# Patient Record
Sex: Male | Born: 1935 | Race: White | Hispanic: No | Marital: Married | Smoking: Former smoker
Health system: Southern US, Community
[De-identification: ages and names within clinical notes are randomized; demographics above are authoritative.]

## PROBLEM LIST (undated history)

## (undated) DIAGNOSIS — J189 Pneumonia, unspecified organism: Secondary | ICD-10-CM

## (undated) DIAGNOSIS — C801 Malignant (primary) neoplasm, unspecified: Secondary | ICD-10-CM

## (undated) DIAGNOSIS — I10 Essential (primary) hypertension: Secondary | ICD-10-CM

## (undated) DIAGNOSIS — E7800 Pure hypercholesterolemia, unspecified: Secondary | ICD-10-CM

## (undated) DIAGNOSIS — H3321 Serous retinal detachment, right eye: Secondary | ICD-10-CM

## (undated) DIAGNOSIS — I639 Cerebral infarction, unspecified: Secondary | ICD-10-CM

## (undated) HISTORY — DX: Essential (primary) hypertension: I10

## (undated) HISTORY — DX: Cerebral infarction, unspecified: I63.9

## (undated) HISTORY — DX: Pure hypercholesterolemia, unspecified: E78.00

## (undated) HISTORY — DX: Pneumonia, unspecified organism: J18.9

## (undated) HISTORY — PX: APPENDECTOMY: SHX54

## (undated) HISTORY — PX: CATARACT EXTRACTION: SUR2

## (undated) HISTORY — DX: Serous retinal detachment, right eye: H33.21

---

## 1998-09-03 ENCOUNTER — Ambulatory Visit: Admission: RE | Admit: 1998-09-03 | Discharge: 1998-09-03 | Payer: Self-pay | Admitting: Family Medicine

## 1998-10-11 ENCOUNTER — Ambulatory Visit (HOSPITAL_BASED_OUTPATIENT_CLINIC_OR_DEPARTMENT_OTHER): Admission: RE | Admit: 1998-10-11 | Discharge: 1998-10-11 | Payer: Self-pay | Admitting: Orthopedic Surgery

## 1999-02-19 ENCOUNTER — Ambulatory Visit (HOSPITAL_COMMUNITY): Admission: RE | Admit: 1999-02-19 | Discharge: 1999-02-20 | Payer: Self-pay | Admitting: Ophthalmology

## 1999-09-17 ENCOUNTER — Encounter: Admission: RE | Admit: 1999-09-17 | Discharge: 1999-09-17 | Payer: Self-pay | Admitting: *Deleted

## 2001-05-29 ENCOUNTER — Encounter: Payer: Self-pay | Admitting: Internal Medicine

## 2001-05-29 ENCOUNTER — Inpatient Hospital Stay (HOSPITAL_COMMUNITY): Admission: EM | Admit: 2001-05-29 | Discharge: 2001-05-31 | Payer: Self-pay

## 2001-05-30 ENCOUNTER — Encounter: Payer: Self-pay | Admitting: Internal Medicine

## 2001-08-09 ENCOUNTER — Encounter: Payer: Self-pay | Admitting: Ophthalmology

## 2001-08-10 ENCOUNTER — Ambulatory Visit (HOSPITAL_COMMUNITY): Admission: RE | Admit: 2001-08-10 | Discharge: 2001-08-10 | Payer: Self-pay | Admitting: Ophthalmology

## 2001-11-02 ENCOUNTER — Ambulatory Visit (HOSPITAL_COMMUNITY): Admission: RE | Admit: 2001-11-02 | Discharge: 2001-11-02 | Payer: Self-pay | Admitting: Ophthalmology

## 2005-04-27 ENCOUNTER — Inpatient Hospital Stay (HOSPITAL_COMMUNITY): Admission: EM | Admit: 2005-04-27 | Discharge: 2005-04-29 | Payer: Self-pay | Admitting: Emergency Medicine

## 2005-05-14 ENCOUNTER — Encounter: Admission: RE | Admit: 2005-05-14 | Discharge: 2005-05-14 | Payer: Self-pay | Admitting: Family Medicine

## 2005-10-31 ENCOUNTER — Encounter: Admission: RE | Admit: 2005-10-31 | Discharge: 2005-10-31 | Payer: Self-pay | Admitting: Family Medicine

## 2006-11-02 ENCOUNTER — Encounter: Admission: RE | Admit: 2006-11-02 | Discharge: 2006-11-02 | Payer: Self-pay | Admitting: Family Medicine

## 2006-11-19 ENCOUNTER — Encounter: Admission: RE | Admit: 2006-11-19 | Discharge: 2006-11-19 | Payer: Self-pay | Admitting: General Surgery

## 2007-03-11 ENCOUNTER — Encounter: Admission: RE | Admit: 2007-03-11 | Discharge: 2007-03-11 | Payer: Self-pay | Admitting: Family Medicine

## 2007-03-22 ENCOUNTER — Ambulatory Visit (HOSPITAL_COMMUNITY): Admission: RE | Admit: 2007-03-22 | Discharge: 2007-03-22 | Payer: Self-pay | Admitting: Ophthalmology

## 2007-04-19 ENCOUNTER — Ambulatory Visit: Payer: Self-pay | Admitting: Internal Medicine

## 2007-04-28 ENCOUNTER — Encounter (INDEPENDENT_AMBULATORY_CARE_PROVIDER_SITE_OTHER): Payer: Self-pay | Admitting: Diagnostic Radiology

## 2007-04-28 ENCOUNTER — Ambulatory Visit (HOSPITAL_COMMUNITY): Admission: RE | Admit: 2007-04-28 | Discharge: 2007-04-28 | Payer: Self-pay | Admitting: Internal Medicine

## 2007-05-05 ENCOUNTER — Ambulatory Visit: Payer: Self-pay | Admitting: Internal Medicine

## 2007-05-06 ENCOUNTER — Ambulatory Visit: Payer: Self-pay | Admitting: Internal Medicine

## 2007-06-02 ENCOUNTER — Ambulatory Visit: Payer: Self-pay | Admitting: Thoracic Surgery

## 2007-06-08 ENCOUNTER — Ambulatory Visit (HOSPITAL_COMMUNITY): Admission: RE | Admit: 2007-06-08 | Discharge: 2007-06-08 | Payer: Self-pay | Admitting: Thoracic Surgery

## 2007-06-09 ENCOUNTER — Ambulatory Visit: Admission: RE | Admit: 2007-06-09 | Discharge: 2007-07-14 | Payer: Self-pay | Admitting: Radiation Oncology

## 2007-06-22 ENCOUNTER — Encounter: Payer: Self-pay | Admitting: Thoracic Surgery

## 2007-06-22 ENCOUNTER — Ambulatory Visit: Payer: Self-pay | Admitting: Thoracic Surgery

## 2007-06-22 ENCOUNTER — Inpatient Hospital Stay (HOSPITAL_COMMUNITY): Admission: RE | Admit: 2007-06-22 | Discharge: 2007-06-26 | Payer: Self-pay | Admitting: Thoracic Surgery

## 2007-06-30 ENCOUNTER — Ambulatory Visit: Payer: Self-pay | Admitting: Thoracic Surgery

## 2007-06-30 ENCOUNTER — Encounter: Admission: RE | Admit: 2007-06-30 | Discharge: 2007-06-30 | Payer: Self-pay | Admitting: Thoracic Surgery

## 2007-07-13 ENCOUNTER — Encounter: Admission: RE | Admit: 2007-07-13 | Discharge: 2007-07-13 | Payer: Self-pay | Admitting: Thoracic Surgery

## 2007-07-13 ENCOUNTER — Ambulatory Visit: Payer: Self-pay | Admitting: Thoracic Surgery

## 2007-07-13 ENCOUNTER — Ambulatory Visit: Payer: Self-pay | Admitting: Internal Medicine

## 2007-07-15 ENCOUNTER — Ambulatory Visit: Admission: RE | Admit: 2007-07-15 | Discharge: 2007-10-04 | Payer: Self-pay | Admitting: Radiation Oncology

## 2007-07-28 ENCOUNTER — Encounter: Payer: Self-pay | Admitting: Internal Medicine

## 2007-07-28 LAB — CBC WITH DIFFERENTIAL/PLATELET
BASO%: 0.2 % (ref 0.0–2.0)
EOS%: 2.1 % (ref 0.0–7.0)
HCT: 34.1 % — ABNORMAL LOW (ref 38.7–49.9)
LYMPH%: 15 % (ref 14.0–48.0)
MCH: 31.9 pg (ref 28.0–33.4)
MCHC: 34.1 g/dL (ref 32.0–35.9)
MCV: 93.5 fL (ref 81.6–98.0)
MONO%: 6.8 % (ref 0.0–13.0)
NEUT%: 75.9 % — ABNORMAL HIGH (ref 40.0–75.0)
Platelets: 254 10*3/uL (ref 145–400)
RBC: 3.65 10*6/uL — ABNORMAL LOW (ref 4.20–5.71)

## 2007-07-28 LAB — COMPREHENSIVE METABOLIC PANEL
ALT: 13 U/L (ref 0–53)
AST: 21 U/L (ref 0–37)
Alkaline Phosphatase: 71 U/L (ref 39–117)
CO2: 25 mEq/L (ref 19–32)
Creatinine, Ser: 1.36 mg/dL (ref 0.40–1.50)
Total Bilirubin: 0.4 mg/dL (ref 0.3–1.2)

## 2007-08-12 ENCOUNTER — Encounter: Admission: RE | Admit: 2007-08-12 | Discharge: 2007-08-12 | Payer: Self-pay | Admitting: Thoracic Surgery

## 2007-08-12 ENCOUNTER — Ambulatory Visit: Payer: Self-pay | Admitting: Thoracic Surgery

## 2007-08-23 ENCOUNTER — Ambulatory Visit (HOSPITAL_COMMUNITY): Admission: RE | Admit: 2007-08-23 | Discharge: 2007-08-23 | Payer: Self-pay | Admitting: Ophthalmology

## 2007-10-07 ENCOUNTER — Ambulatory Visit: Payer: Self-pay | Admitting: Thoracic Surgery

## 2007-10-07 ENCOUNTER — Encounter: Admission: RE | Admit: 2007-10-07 | Discharge: 2007-10-07 | Payer: Self-pay | Admitting: Thoracic Surgery

## 2007-12-31 ENCOUNTER — Ambulatory Visit: Payer: Self-pay | Admitting: Internal Medicine

## 2008-01-04 ENCOUNTER — Ambulatory Visit (HOSPITAL_COMMUNITY): Admission: RE | Admit: 2008-01-04 | Discharge: 2008-01-04 | Payer: Self-pay | Admitting: Internal Medicine

## 2008-01-04 LAB — CBC WITH DIFFERENTIAL/PLATELET
BASO%: 0.3 % (ref 0.0–2.0)
EOS%: 2.7 % (ref 0.0–7.0)
HCT: 37.2 % — ABNORMAL LOW (ref 38.7–49.9)
LYMPH%: 17.2 % (ref 14.0–48.0)
MCH: 32.2 pg (ref 28.0–33.4)
MCHC: 34.2 g/dL (ref 32.0–35.9)
MONO#: 0.6 10*3/uL (ref 0.1–0.9)
NEUT%: 70.6 % (ref 40.0–75.0)
Platelets: 195 10*3/uL (ref 145–400)
RBC: 3.95 10*6/uL — ABNORMAL LOW (ref 4.20–5.71)
WBC: 6.7 10*3/uL (ref 4.0–10.0)
lymph#: 1.1 10*3/uL (ref 0.9–3.3)

## 2008-01-04 LAB — COMPREHENSIVE METABOLIC PANEL
ALT: 27 U/L (ref 0–53)
AST: 34 U/L (ref 0–37)
Creatinine, Ser: 1.41 mg/dL (ref 0.40–1.50)
Sodium: 140 mEq/L (ref 135–145)
Total Bilirubin: 0.7 mg/dL (ref 0.3–1.2)
Total Protein: 6.7 g/dL (ref 6.0–8.3)

## 2008-01-11 ENCOUNTER — Ambulatory Visit: Payer: Self-pay | Admitting: Thoracic Surgery

## 2008-01-11 ENCOUNTER — Encounter: Payer: Self-pay | Admitting: Internal Medicine

## 2008-06-30 ENCOUNTER — Ambulatory Visit: Payer: Self-pay | Admitting: Internal Medicine

## 2008-07-04 ENCOUNTER — Ambulatory Visit (HOSPITAL_COMMUNITY): Admission: RE | Admit: 2008-07-04 | Discharge: 2008-07-04 | Payer: Self-pay | Admitting: Internal Medicine

## 2008-07-12 LAB — CBC WITH DIFFERENTIAL/PLATELET
BASO%: 0.2 % (ref 0.0–2.0)
EOS%: 5.8 % (ref 0.0–7.0)
HCT: 36.2 % — ABNORMAL LOW (ref 38.7–49.9)
MCH: 33 pg (ref 28.0–33.4)
MCHC: 34.1 g/dL (ref 32.0–35.9)
MONO#: 0.5 10*3/uL (ref 0.1–0.9)
NEUT%: 70 % (ref 40.0–75.0)
RBC: 3.75 10*6/uL — ABNORMAL LOW (ref 4.20–5.71)
RDW: 13.2 % (ref 11.2–14.6)
WBC: 6.6 10*3/uL (ref 4.0–10.0)
lymph#: 1.1 10*3/uL (ref 0.9–3.3)

## 2008-07-12 LAB — COMPREHENSIVE METABOLIC PANEL
ALT: 31 U/L (ref 0–53)
AST: 30 U/L (ref 0–37)
CO2: 27 mEq/L (ref 19–32)
Calcium: 8.9 mg/dL (ref 8.4–10.5)
Chloride: 104 mEq/L (ref 96–112)
Creatinine, Ser: 1.48 mg/dL (ref 0.40–1.50)
Sodium: 135 mEq/L (ref 135–145)
Total Protein: 7 g/dL (ref 6.0–8.3)

## 2008-07-18 ENCOUNTER — Ambulatory Visit: Payer: Self-pay | Admitting: Thoracic Surgery

## 2008-07-28 ENCOUNTER — Encounter: Admission: RE | Admit: 2008-07-28 | Discharge: 2008-07-28 | Payer: Self-pay | Admitting: Family Medicine

## 2008-12-25 ENCOUNTER — Ambulatory Visit: Payer: Self-pay | Admitting: Internal Medicine

## 2008-12-27 ENCOUNTER — Ambulatory Visit (HOSPITAL_COMMUNITY): Admission: RE | Admit: 2008-12-27 | Discharge: 2008-12-27 | Payer: Self-pay | Admitting: Internal Medicine

## 2008-12-27 LAB — CBC WITH DIFFERENTIAL/PLATELET
BASO%: 0.2 % (ref 0.0–2.0)
EOS%: 5.2 % (ref 0.0–7.0)
HCT: 37.1 % — ABNORMAL LOW (ref 38.4–49.9)
HGB: 12.9 g/dL — ABNORMAL LOW (ref 13.0–17.1)
MCHC: 34.8 g/dL (ref 32.0–36.0)
MONO#: 0.5 10*3/uL (ref 0.1–0.9)
NEUT%: 63.5 % (ref 39.0–75.0)
RDW: 13.1 % (ref 11.0–14.6)
WBC: 5.3 10*3/uL (ref 4.0–10.3)
lymph#: 1.2 10*3/uL (ref 0.9–3.3)

## 2008-12-27 LAB — COMPREHENSIVE METABOLIC PANEL
ALT: 26 U/L (ref 0–53)
AST: 30 U/L (ref 0–37)
Albumin: 3.5 g/dL (ref 3.5–5.2)
CO2: 28 mEq/L (ref 19–32)
Calcium: 9 mg/dL (ref 8.4–10.5)
Chloride: 105 mEq/L (ref 96–112)
Creatinine, Ser: 1.78 mg/dL — ABNORMAL HIGH (ref 0.40–1.50)
Potassium: 4.5 mEq/L (ref 3.5–5.3)
Total Protein: 6.9 g/dL (ref 6.0–8.3)

## 2009-01-03 ENCOUNTER — Encounter: Payer: Self-pay | Admitting: Internal Medicine

## 2009-06-28 ENCOUNTER — Ambulatory Visit: Payer: Self-pay | Admitting: Internal Medicine

## 2009-07-02 ENCOUNTER — Ambulatory Visit (HOSPITAL_COMMUNITY): Admission: RE | Admit: 2009-07-02 | Discharge: 2009-07-02 | Payer: Self-pay | Admitting: Internal Medicine

## 2009-07-02 LAB — CBC WITH DIFFERENTIAL/PLATELET
Basophils Absolute: 0 10*3/uL (ref 0.0–0.1)
HCT: 37.6 % — ABNORMAL LOW (ref 38.4–49.9)
HGB: 12.6 g/dL — ABNORMAL LOW (ref 13.0–17.1)
MONO#: 0.6 10*3/uL (ref 0.1–0.9)
NEUT#: 5 10*3/uL (ref 1.5–6.5)
NEUT%: 70.1 % (ref 39.0–75.0)
WBC: 7.1 10*3/uL (ref 4.0–10.3)
lymph#: 1.2 10*3/uL (ref 0.9–3.3)

## 2009-07-02 LAB — COMPREHENSIVE METABOLIC PANEL
ALT: 27 U/L (ref 0–53)
Albumin: 3.8 g/dL (ref 3.5–5.2)
BUN: 24 mg/dL — ABNORMAL HIGH (ref 6–23)
CO2: 27 mEq/L (ref 19–32)
Calcium: 9.1 mg/dL (ref 8.4–10.5)
Chloride: 105 mEq/L (ref 96–112)
Creatinine, Ser: 1.81 mg/dL — ABNORMAL HIGH (ref 0.40–1.50)
Potassium: 4.5 mEq/L (ref 3.5–5.3)

## 2009-07-04 ENCOUNTER — Encounter: Payer: Self-pay | Admitting: Internal Medicine

## 2009-12-27 ENCOUNTER — Ambulatory Visit: Payer: Self-pay | Admitting: Internal Medicine

## 2009-12-31 ENCOUNTER — Ambulatory Visit (HOSPITAL_COMMUNITY): Admission: RE | Admit: 2009-12-31 | Discharge: 2009-12-31 | Payer: Self-pay | Admitting: Internal Medicine

## 2009-12-31 LAB — CBC WITH DIFFERENTIAL/PLATELET
Basophils Absolute: 0 10*3/uL (ref 0.0–0.1)
EOS%: 5.2 % (ref 0.0–7.0)
HCT: 38.1 % — ABNORMAL LOW (ref 38.4–49.9)
HGB: 12.6 g/dL — ABNORMAL LOW (ref 13.0–17.1)
LYMPH%: 16.6 % (ref 14.0–49.0)
MCH: 32.4 pg (ref 27.2–33.4)
MCV: 97.8 fL (ref 79.3–98.0)
MONO%: 6.4 % (ref 0.0–14.0)
NEUT%: 71.6 % (ref 39.0–75.0)
Platelets: 196 10*3/uL (ref 140–400)

## 2009-12-31 LAB — COMPREHENSIVE METABOLIC PANEL
AST: 33 U/L (ref 0–37)
Alkaline Phosphatase: 41 U/L (ref 39–117)
BUN: 25 mg/dL — ABNORMAL HIGH (ref 6–23)
Calcium: 9.1 mg/dL (ref 8.4–10.5)
Chloride: 105 mEq/L (ref 96–112)
Creatinine, Ser: 2.06 mg/dL — ABNORMAL HIGH (ref 0.40–1.50)

## 2010-01-02 ENCOUNTER — Encounter: Payer: Self-pay | Admitting: Internal Medicine

## 2010-06-28 ENCOUNTER — Ambulatory Visit: Payer: Self-pay | Admitting: Internal Medicine

## 2010-07-01 ENCOUNTER — Ambulatory Visit (HOSPITAL_COMMUNITY)
Admission: RE | Admit: 2010-07-01 | Discharge: 2010-07-01 | Payer: Self-pay | Source: Home / Self Care | Attending: Internal Medicine | Admitting: Internal Medicine

## 2010-07-01 LAB — COMPREHENSIVE METABOLIC PANEL
BUN: 23 mg/dL (ref 6–23)
CO2: 25 mEq/L (ref 19–32)
Calcium: 8.1 mg/dL — ABNORMAL LOW (ref 8.4–10.5)
Chloride: 103 mEq/L (ref 96–112)
Creatinine, Ser: 1.97 mg/dL — ABNORMAL HIGH (ref 0.40–1.50)
Glucose, Bld: 132 mg/dL — ABNORMAL HIGH (ref 70–99)

## 2010-07-01 LAB — CBC WITH DIFFERENTIAL/PLATELET
Basophils Absolute: 0 10*3/uL (ref 0.0–0.1)
EOS%: 4.4 % (ref 0.0–7.0)
HCT: 35.6 % — ABNORMAL LOW (ref 38.4–49.9)
HGB: 12.3 g/dL — ABNORMAL LOW (ref 13.0–17.1)
MCH: 33.3 pg (ref 27.2–33.4)
NEUT%: 70.9 % (ref 39.0–75.0)
lymph#: 1.2 10*3/uL (ref 0.9–3.3)

## 2010-07-03 ENCOUNTER — Encounter: Payer: Self-pay | Admitting: Internal Medicine

## 2010-08-03 ENCOUNTER — Other Ambulatory Visit: Payer: Self-pay | Admitting: Internal Medicine

## 2010-08-03 DIAGNOSIS — C3490 Malignant neoplasm of unspecified part of unspecified bronchus or lung: Secondary | ICD-10-CM

## 2010-08-04 ENCOUNTER — Encounter: Payer: Self-pay | Admitting: Thoracic Surgery

## 2010-08-15 NOTE — Letter (Signed)
[facility]   Imported By: Lester [place] 08/24/2009 09:41:50  _____________________________________________________________________  External Attachment:    Type:   Image     Comment:   External Document

## 2010-08-15 NOTE — Letter (Signed)
[facility]   Imported By: Lester Fannin 01/22/2010 09:04:26  _____________________________________________________________________  External Attachment:    Type:   Image     Comment:   External Document

## 2010-08-15 NOTE — Letter (Signed)
Summary: [facility]  [facility]   Imported By: Lester Long 07/17/2010 09:47:55  _____________________________________________________________________  External Attachment:    Type:   Image     Comment:   External Document

## 2010-11-26 NOTE — Op Note (Signed)
[name], VULTAGGIO               ACCOUNT NO.:  [id number]   MEDICAL RECORD NO.:  [id number]          PATIENT TYPE:  INP   LOCATION:  2550                         FACILITY:  [facility]   PHYSICIAN:  Ines Bloomer, M.D. DATE OF BIRTH:  Mar 16, 1928   DATE OF PROCEDURE:  DATE OF DISCHARGE:                               OPERATIVE REPORT   PREOPERATIVE DIAGNOSES:  Right lower lobe mass.   POSTOPERATIVE DIAGNOSES:  Adenocarcinoma right lower lobe.   OPERATION PERFORMED:  Right VATS, mini thoracotomy, right lower lobe  superior segmentectomy with seed implantation.   SURGEON:  Ines Bloomer, M.D.   FIRST ASSISTANT:  Coral Ceo, P.A.   ANESTHESIA:  General anesthesia.   After percutaneous insertion of all monitored lines, the patient  underwent general anesthesia and was prepped and draped in the usual  sterile manner, turned in the right lateral thoracotomy position. A dual  lumen tube was inserted, the right lung was deflated, two trocar sites  remained in the anterior and mid axillary line at the 7th intercostal  space. Two trocars were inserted and the lung was deflated and then a  small 7-8 cm incision was made over the triangle of auscultation  dividing the latissimus and reflecting the serratus anteriorly and  entering the fifth intercostal space. A smallTuffier was placed in the  space. The dissection was started in the fissure, there was complete  fissure. We had to dissect down until we finally identified the artery  and then dissecting posteriorly dissected up the subcarinal space  removing  7 nodes and then dissecting up along the bronchus intermedius.  We then turned back to the fissure and identified a large superior  segmental artery which was divided  proximally and distally with 0 silk  clips and divided it. This exposed several 10R and 11R nodes which were  dissected free from around the bronchus and the bronchus intermedius and  the superior segmental bronchus.  The superior segmental bronchus ws  dissected out, stapled and divided with the auto suture 30 mm blue  stapler. The inferior pulmonary vein to the superior segment was then  doubly ligated with #0 silk, clipped and divided and the superior  segment was divided first dividing down the major fissure with the  autosuture stapler and 2 applications of the green stapler and then  dividing across the superior segment with the autosuture stapler and the  EZ-45 Ethicon stapler. The superior segment was then placed in an  Endobag and removed. Frozen section revealed adenocarcinoma with  negative margins. The Coseal  was applied to the staple line and then we  placed sutures along the staple line to suture in the radioactive seeds  using a coronary marker at the spot closest to the cancer and sutured  that in place with 4-0 Prolene and then suturing that to the seed mesh.  When the seed had been sutured down and lay just right in the fissure,  two chest tubes were brought in through the trocar sites and tied in  place with #0 silk. A Marcaine block was done in the usual fashion.  A  single On-  Q was inserted in the usual fashion. The chest was closed with three  pericostals drilling to the 7th rib and passed around the 6th rib with  Vicryl in the muscle layer, 2-0 Vicryl in the subcutaneous tissue and  Dermabond for the skin. The patient was returned to the recovery room in  stable condition.      Ines Bloomer, M.D.  Electronically Signed     DPB/MEDQ  D:  06/22/2007  T:  06/22/2007  Job:  [id number]   cc:   Artist Pais Kathrynn Running, M.D.  Kalman Shan, MD

## 2010-11-26 NOTE — Letter (Signed)
May 06, 2007    [name], M.D.  [address] [place], [place] [address]   RE:  Antonio, [name]  MRN:  [id number]  /  DOB:  [birth year]   Dear Dr. Arvilla Market:   PROBLEM LIST:  1. Pulmonary nodule, right lower lobe superior segment, 1.1 cm = >95%      pre-test probability for nsclc  2. CT-guided transthoracic needle aspiration on April 28, 2007,      highly suspicious for non-small cell lung cancer.  3. Normal pulmonary function tests, FEV1 2.3 L, 85% predicted, DLCO      79% predicted.   HISTORY OF PRESENT ILLNESS:  [name] returns for followup  after undergoing CT-guided transthoracic needle aspiration of his right  lower lobe superior segment nodule on April 28, 2007.  The radiologist  felt this was a technically challenging study.  The first attempt was  needle aspiration that showed atypical cells which are highly suspicious  for non-small cell lung carcinoma.  This was followed by a CT-guided  transthoracic core biopsy which showed fibro connective tissue stroma  with scanty epithelial cells.  They did not identify malignancy in this.  In the interim he reports no new symptoms.  He feels fine and is  asymptomatic.   PAST MEDICAL HISTORY:  Reviewed, same as April 19, 2007.   Past surgical history, allergies, medications, social history, family  history, review of systems all unchanged compared to April 19, 2007.   PHYSICAL EXAMINATION:  VITAL SIGNS:  Temperature 97.3, blood pressure  132/62, pulse of 71, saturation 98% on room air.  Exam is unchanged from  April 19, 2007.  GENERAL EXAM:  A pleasant male accompanied by wife.  CENTRAL NERVOUS SYSTEM:  Alert and oriented x3.  Walks with a cane due  to joint disease.  HEENT:  Neck is supple, no neck nodes, no elevated JVP.  Respiratory air entry equal on both sides.  CARDIOVASCULAR:  Normal heart sounds.  ABDOMEN:  Soft, obese.  EXTREMITIES:  No cyanosis, no clubbing.  SKIN:  Shows signs of  aging and easy bruising.   LABORATORY EVALUATION:  1. Pulmonary function tests, May 05, 2007, FEV1 2.3 L, 85%, FVC      3.8 L, 90% predicted.  FEV1 FVC ratio is 62% against a predicted of      67%.  The small airway FEF of 25-75 is reduced at 0.83 L, 35%.      Total lung capacity is 6.7 L, 104%, adjusted DLCO was 17.3 mmHg,      79%.  In summary, spirometry and lung volumes are normal.  Perhaps      there is some small airway disease as shown in reduced FEF 25-75      and DLCO being lower limit of normal.   1. CT-guided transthoracic needle aspiration site shows atypical cells      suggestive of non-small cell lung cancer.  Core biopsy did not      identify malignancy.   ASSESSMENT AND PLAN:  Based on [name]'s age, smoking history,  size of nodule, growth over 2 years, and the CT-guided transthoracic  needle aspiration suggesting malignant cells, the pretest probability of  this, indeed, being non-small cell lung cancer is extremely high.  I ran  a calculation on chestx-ray.com, solitary pulmonary nodule calculator,  and the pretest probability was as high as 95% to 98%.  Even assuming  that nly 30% of our patients comes into clinic with a  nodule have  malignancy, the pretest probability is still higher than 90%.   PLAN:  Given normal lung function, I advised referral to Dr. Karle Plumber, thoracic surgeon, for possible evaluation of wedge biopsy and  subsequent lobectomy.  [name] was extremely concerned about his  quality of life.  He did not want himself to suffer or cause any  suffering for his wife and family.  I explained to him that once PET  scan is done, this would most likely be stage I lung cancer which has  60% to 70% five year survival.  Peri and postoperative pulmonary risk is  less than 5%, cardiac risk is also minimal.  Therefore, he could be  willing to undergo lobectomy and have a healthy quality of life after  surgery and subsequent physical  therapy.  He was still reluctant to be  seen by the surgeon.  I then offered him the possibility of undergoing  curative radiation therapy which has low morbidity risk, but has a lower  5-year survival rate.  He was also reluctant to go through this.   I asked his wife what he would want to do or what she would want to do.  She deferred decision to [name].  He left the clinic saying he  wanted to think about all his different options.  He thanked me for  explaining everything clearly.  He will be in touch with [place].  For now he  has  refused to have his PET scan or be seen by Dr. Edwyna Shell.  I even  encouraged him to just go visit with Dr. Edwyna Shell or the radiation  oncologist just for a chat, but he refused.  He wants to evaluate and  think about all this and then call us back.  We gave him the phone  number for clinic, and he will contact us at his convenience.    Sincerely,      Kalman Shan, MD  Electronically Signed    MR/MedQ  DD: 05/06/2007  DT: 05/07/2007  Job #: [id number]

## 2010-11-26 NOTE — Assessment & Plan Note (Signed)
OFFICE VISIT   [name], [name]  DOB:  June 07, 1928                                        October 07, 2007  CHART #:  [id number]   The patient came for follow-up today.  He is doing well.  It is  approximately three months since we did his surgery.  His blood pressure  is 140/80, pulse 80, respirations 18, and saturations 94%.   Chest x-ray shows normal postoperative changes and seems to be in good  position.  I plan to see him back again in three months after his  scheduled CT scan.   Ines Bloomer, M.D.  Electronically Signed   DPB/MEDQ  D:  10/07/2007  T:  10/08/2007  Job:  [id number]

## 2010-11-26 NOTE — Discharge Summary (Signed)
[name], [name]               ACCOUNT NO.:  [id number]   MEDICAL RECORD NO.:  [id number]          PATIENT TYPE:  REC   LOCATION:  RDNC                         FACILITY:  [facility]   PHYSICIAN:  Ines Bloomer, M.D. DATE OF BIRTH:  [birth year]   DATE OF ADMISSION:  06/09/2007  DATE OF DISCHARGE:                               DISCHARGE SUMMARY   FINAL DIAGNOSIS:  Right upper lobe lesion, positive for adenocarcinoma  stage 1AT1M0MX.   SECONDARY DIAGNOSES:  1. Hyperlipidemia.  2. Hypertension.  3. Diabetes mellitus.  4. History of cerebrovascular accident 2003 with some weakness, left-      sided, walks with a cane.  5. Status post appendectomy.  6. 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 Luciana Axe.      No vision in right eye.   OPERATIONS/PROCEDURES:  Right video-assisted thoracoscopic surgery for a  mini-thoracotomy, right lower lobe superior segmentectomy with  radioactive seed implantation and lymph node dissection.   HISTORY/PHYSICAL/HOSPITAL COURSE:  The patient is a 77 year old  Caucasian male who was found to have right lower lobe lesion with  atypical cells on needle biopsy.  He has a long history of granulomas  disease and chronic bronchitis.  Pulmonary function tests showed an FVC  3.79 with an FEV1 of 2.48, infusion capacity of 79%.  The lesion appears  to have a ground-glass appearance and it was also felt this could be  consistent with bronchioalveolar cancer.  PET scan was done showing a  low-level amount of metabolic activity being cancer in the lesion and no  evidence of metastatic disease.  The patient was recommended for a wedge  resection.  The patient was seen and evaluated by Dr. Edwyna Shell.  Dr.  Edwyna Shell discussed with the patient undergoing right VATS with wedge  resection as well as radioactive seed implantation.  He discussed risks  and benefits with the patient.  The patient acknowledged understanding  and agreed to proceed.  Surgery was  scheduled for June 22, 2007.  For  details of the patient's past medical history and physical exam, please  see dictated H&P.   The patient was taken to the operating room where he underwent right  video-assisted thoracoscopic surgery with a right mini-thoracotomy,  right lower lobe superior segmentectomy with radioactive seed  implantation and lymph node dissection.  The patient tolerated the  procedure well and was transferred to the intensive care unit in stable  condition.  The patient's pathology report came back positive showing  adenocarcinoma stage 1AT1M0MX.  The patient's postoperative course was  pretty much unremarkable.  Daily chest x-rays obtained.  Chest x-rays  remained stable and no air leak noted from  chest tube.  Chest tube was  able to be discontinued postop day 2.  Follow up chest x-ray was stable  with no pneumothorax.  The patient was encouraged to use his incentive  spirometer and he eventually was able to be weaned off oxygen sating  greater than 90% on room air.  Postoperatively, the patient's vital  signs were monitored closely.  He remained afebrile.  All vital  signs  remained stable.  The patient's heart rate and blood pressure were  stable and he was not restarted on his lisinopril/HCTZ.  Will follow up  with this as an outpatient.  Postoperatively, the patient's blood sugars  were also followed closely.  The patient does have a history diabetes  mellitus.  He was restarted on his NovoLog as well as Lantus insulin.  The patient had several episodes of hypoglycemia.  NovoLog doses were  decreased and he was continued on Lantus insulin.  Blood sugars were  stable prior to discharge home.  The patient's pulmonary status remained  stable.  He remained in normal sinus rhythm postoperatively.  All  incisions were clean, dry, intact and healing well.  The patient was out  of bed ambulating well without difficulty.  He was tolerating diet well.  No nausea or  vomiting noted.  Noted the patient did have elevated  creatinine level, increased to 1.67.  This was another reason why the  patient's lisinopril was ordered and diuretic was not started.  By  postop day 3, creatinine stabilized to 1.38.  Foley was discontinued and  the patient was started on Flomax.  The patient voiding without  difficulty.   LABORATORY DATA:  Labs postop day 2 showed a white count 12.1,  hemoglobin 11.5, hematocrit 33.8, platelet count 184.  Postop day 3, he  had sodium of 133, potassium 3.8, chloride of 100, bicarbonate 28, BUN  21, creatinine 1.3, glucose of 101.   DISPOSITION:  Patient is tentatively ready for discharge home over the  next 48 hours pending he remains stable.   FOLLOW UP:  A follow up appointment has been arranged with Dr. Edwyna Shell  for June 30, 2007 at 3:15 p.m.  The patient will need to obtain [facility]  chest x-ray 30 minutes prior to this appointment.   ACTIVITY:  Patient instructed no driving till released to do so, no  lifting over 10 pounds.  He is told to ambulate 3-4 times per day,  progress as tolerated and to continue his breathing exercises.   INCISIONAL CARE:  The patient is told to shower washing his incisions  using soap and water.  He is to contact the office if he develops any  drainage or opening from any of his incision sites.   DIET:  The patient is educated on diet to be low-fat, low-salt.   DISCHARGE MEDICATIONS:  1. Lovastatin 20 mg at night.  2. Tricor 145 mg at night.  3. NovoLog 5 units with breakfast; 10 units with lunch and 11 units at      dinner.  4. Fish oil daily.  5. Stool softener as needed.  6. Flomax 0.4 mg daily.  7. Percocet 5/325 1-2 tablets q.4-6 h. p.r.n. pain.  8. The patient is currently on Lantus insulin.  Plan to restart the      patient on Levemir prior to discharge home.  Will follow blood      sugars and prescribe dosage.      Theda Belfast, [place]      Ines Bloomer, M.D.   Electronically Signed    KMD/MEDQ  D:  06/25/2007  T:  06/26/2007  Job:  [id number]   cc:   Edwyna Shell, MD

## 2010-11-26 NOTE — Letter (Signed)
July 18, 2008   Lajuana Matte, MD  [phone]  [place], [place] [address]   Re:  [name], [name]               DOB:  August 16, 1927   Dear Arbutus Ped:   I saw the patient back in today and reviewed his CT scan.  It shows no  evidence of recurrence now 1 year since he had his right lower lobe  superior segmentectomy for a stage IA non-small cell lung cancer.  He  will get another CT scan in 6 months and I will be referring him to you  for long term followup.  If he has any evidence of recurrence, I will be  happy to see him again.  I appreciate the opportunity of seeing the  patient.   Ines Bloomer, M.D.  Electronically Signed   DPB/MEDQ  D:  07/18/2008  T:  07/18/2008  Job:  [id number]

## 2010-11-26 NOTE — Assessment & Plan Note (Signed)
OFFICE VISIT   [name], VIERNES  DOB:  1927/09/17                                        January 11, 2008  CHART #:  [id number]   The patient came today and had a CT scan which showed no evidence of  recurrence of his cancer.  He is doing well overall.  We will see him  back again in December with another CT scan.  Incision is well healed.  His blood pressure was 122/65, pulse 85, respirations 20, and  saturations were 90%.   Ines Bloomer, M.D.  Electronically Signed   DPB/MEDQ  D:  01/11/2008  T:  01/12/2008  Job:  [id number]

## 2010-11-26 NOTE — Letter (Signed)
April 19, 2007    Donia Guiles, M.D.  [address] [place], [place] [address]   RE:  [name], [name]  MRN:  [id number]  /  DOB:  Nov 14, 1927   Dear Dr. Arvilla Market:   CHIEF COMPLAINT:  Pulmonary nodule, right lower lobe superior segment.   Thank you for referring [name].  As you know, he is a  pleasant gentleman, a 77 year old gentleman with right lower lobe lung  nodule.  History from him is that he suffered a pneumonia in October  2006.  He was admitted at [facility] and treated with IV  antibiotics.  At that time a chest x-ray showed possible left lower lobe  pneumonia and a right lower lobe nodule.  This was followed up with a CT  scan of the chest in November 2006, which showed a right lower lobe  superior segment peribronchial thickening with a ground-glass opacity in  image #26 along with a cyst in image #29.  There were also other nodules  in the right upper lobe posterior segment and then left upper lobe  anterior segment, all less than 3 mm.  He says he was followed up  serially but now the right lower lobe segment is growing and therefore  he has been referred here.  The results of these serial scans are  discussed below in my laboratory evaluation.   He himself feels fine and he is asymptomatic.  He is not dyspneic for  his activities of daily living.  No cough, no chest pain, no hemoptysis,  no edema.   PAST MEDICAL HISTORY:  1. He suffered a stroke in 2003.  Walks with a cane but otherwise      asymptomatic.  2. Diabetes.  3. Status post eye surgery for detached retina 4 weeks ago.  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  4. Pneumonia admission in October 2006.   PAST SURGICAL HISTORY:  Status post appendectomy.   ALLERGIES:  No known allergies.   MEDICATIONS:  1. Omega-3 once daily.  2. Lovastatin 40 mg once daily.  3. Tricort 145 mg once daily.  4. Lisinopril/hydrochlorothiazide once daily.  5.  Levemir as directed.  6. NovoLog as directed.   SOCIAL HISTORY:  He is retired.  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  He is retired for quite awhile.  He is  married, lives with his wife, and has three children, age 23, 19 and 57.  He smoked for 62 years but quit two years ago at the time of his  pneumonia.   FAMILY HISTORY:  No one with emphysema.  His mother had a pancreas  problem, unspecified.   REVIEW OF SYSTEMS:  This is documented in the history of present illness  and the questionnaire.  Significant of diminished vision in the right  eye following detached retina.  He walks with a cane for the last 3-4  years because of his stroke and back problems.  Chronic cough during the  time he smoked but now asymptomatic.   PHYSICAL EXAM:  VITAL SIGNS:  Weight 215.6 pounds, temperature 97.9,  blood pressure 132/62, pulse of 67, pulse oximetry 97% on room air.  GENERAL EXAM:  This is a pleasant male, seated comfortably in the exam  room.  Accompanied by his wife.  CENTRAL NERVOUS SYSTEM:  Alert and oriented x3.  Speech and ambulation  are normal.  Walks with a cane due to joint issues.  HEENT:  Neck is supple.  No neck nodes.  No elevated JVP.  The upper  airway Mallampati class is class II-III.  He has upper and lower  dentures.  RESPIRATORY:  Air entry equal on both sides.  Normal vesicular breath  sounds.  CARDIOVASCULAR:  Normal heart sounds.  Regular rate and rhythm, no  murmurs.  CHEST:  No chest wall tenderness.  No kyphosis.  No scoliosis.  ABDOMEN:  Soft, obese, nontender, no organomegaly.  EXTREMITIES:  No cyanosis, no clubbing, no edema.  SKIN:  Shows signs of aging and some easy bruising.   LABORATORY EVALUATION:  I personally reviewed all his CT scans of the  chest in great detail.  His serial scans are dated May 14, 2005,  October 31, 2005, November 02, 2006.  The most current one is March 11, 2007.   1. In his first scan on  May 14, 2005, there was a right upper lobe      posterior segment nodule less than 2-3 mm.  This was present on      follow-up on October 31, 2005, but has since disappeared on November 02, 2006.  2. There was a left upper lobe anterior segment nodule and another      right upper lobe anterior segment nodule seen in November 2006 but      by October 31, 2005, this had resolved.  3. He had a new left upper lobe lateral segment nodule measuring 3.1      mm on October 31, 2005, but this resolved on follow-up in April 2008.  4. In November 2006 there was a ground-glass opacity in image #26      along with a cyst in image #29 in the right lower lobe superior      segment abutting the pleura.  This persisted as a ground-glass      opacity on image #25-31 in April 2007.  On follow-up on November 02, 2006, this had enlarged to a more nodular appearance.  In fact, on      CT scan on March 11, 2007, there was further enlargement.      Currently this lesion extends from image #23 to image #34 in the      right lower lobe superior segment abutting the pleura.  On images      #24, 25 and 26, it had a nodular appearance, measuring 1.1 cm in      its widest axis, but on image #27 it has more like peribronchial      thickening that sort of fades out by image #34.   ASSESSMENT AND PLAN:  In summary, Mr. Rockwell Zentz is a 77 year old ex-  smoker who has had persistent right lower lobe superior segment ground-  glass opacity that has progressed in type to a more nodular appearance  along with peribronchial thickening since November 2006.  He is  asymptomatic.  Lung function is currently unknown.   A possible etiology is a chronic fungal infection or a bronchoalveolar  cell carcinoma.   I explained both possibilities to him.  I have recommended a full set of  lung function studies to evaluate lung function and a CT-guided  transthoracic needle aspiration.  He wants to be sedated for the CT-  guided  transthoracic aspiration.  I have advised him of the possibility  that there is  a 30-40% chance we will have a noncontributory result.  The alternative is to have open lung biopsy but given the high risk of  the procedure, we decided to proceed with CT-guided transthoracic needle  aspiration and core biopsy.  He is aware of the risks of pneumothorax  and  hemothorax with this procedure but given necessity to obtain a  diagnosis, he is going to proceed with this.   I will see him in clinic after pulmonary function tests and CT-guided  biopsy.    Sincerely,      Kalman Shan, MD  Electronically Signed    MR/MedQ  DD: 04/19/2007  DT: 04/20/2007  Job #: [id number]

## 2010-11-26 NOTE — H&P (Signed)
[name], [name]               ACCOUNT NO.:  [id number]   MEDICAL RECORD NO.:  [id number]          PATIENT TYPE:  INP   LOCATION:  NA                           FACILITY:  [facility]   PHYSICIAN:  Ines Bloomer, M.D. DATE OF BIRTH:  May 25, 1928   DATE OF ADMISSION:  06/09/2007  DATE OF DISCHARGE:                              HISTORY & PHYSICAL   HISTORY OF PRESENT ILLNESS:  This 77 year old Caucasian male was found  to have a right lower lobe lesion with atypical cells on needle biopsy.  He has a long history of granulomas disease and chronic bronchitis.  Pulmonary function test showed an FVC of 3.79, FEV1 of 2.48, infusion  opacity of 79%.  The lesion appears to have a ground-glass appearance,  and it was felt that this also could be consistent with bronchioalveolar  cancer.  A PET scan was done, which showed a low-level amount of  metabolic activity, and cancer in the lesion, and there is no evidence  of any metastatic disease.  He is admitted for wedge resection of this,  and if it confirms to be a cancer as suspected, then a seed  implantation.   PAST MEDICAL HISTORY:  1. He had a detached retina in his right eye treated by Dr. Fawn Kirk.  Was treated with a surgery, and has no vision in the right      eye.  2. He has had a previous appendectomy.  3. He also had a cerebrovascular accident in 2003, and has some      weakness on the left side and walks with a cane.  4. He also has diabetes mellitus.  5. Hypertension.  6. Hypercholesterolemia.   ALLERGIES:  None.   MEDICATIONS:  1. Lovastatin 40 mg a day.  2. Tricor 12/45 mg daily.  3. Lisinopril.  4. Hydrochlorothiazide.  5. NovoLog.   FAMILY HISTORY:  Negative for vascular disease and pulmonary disease and  cancer.   SOCIAL HISTORY:  He is married, he has 3 children.  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  He does not  smoke.  He quit smoking about 2 years ago.  He does  not drink alcohol on  a regular basis.   REVIEW OF SYSTEMS:  He is 213 pounds.  He is 5 foot 10.  Weight has been  stable.  CARDIAC:  No angina or atrial fibrillation.  PULMONARY:  No  hemoptysis, fever, chills, but has bronchitis in the past.  GI:  No  nausea, vomiting, constipation, diarrhea or GERD.  GU:  Frequent  urination.  No kidney disease.  VASCULAR:  No claudication and DVT, but  had the stroke with weakness in his right leg.  NEUROLOGICAL:  No  headaches or blackouts.  No seizures.  MUSCULOSKELETAL:  No arthritis or  joint pain.  PSYCHIATRIC:  No psychiatric illness.  ENT:  See past  medical history.  No change in his hearing.  HEMATOLOGICAL:  No problems  with bleeding or clotting disorders or anemia.   PHYSICAL EXAMINATION:  GENERAL:  He is a slightly obese Caucasian male  in no acute distress.  VITAL SIGNS:  His blood pressure is 155/75, pulse 86, respirations 18,  saturation 96%.  HEENT:  Head is atraumatic.  Eyes:  There is eyesight in the right eye.  Pupils equal, react to light and accommodation.  Ears:  Tympanic  membranes are intact.  Nose:  There is no septal deviation.  Throat is  without lesion.  NECK:  Supple without thyromegaly.  There are no carotid bruits.  There  is no supraclavicular or axillary adenopathy.  No thyromegaly.  CHEST:  Clear to auscultation and percussion.  HEART:  Regular sinus rhythm.  No murmurs.  ABDOMEN:  Soft.  No hepatosplenomegaly.  EXTREMITIES:  Pulses are 2+.  There is no clubbing or edema.  NEUROLOGICAL:  He is oriented x3.  Sensory and motor intact.  Cranial  nerves are intact.   IMPRESSION:  1. Right lower lobe lesion, rule out bronchioalveolar cancer.  2. Hypertension.  3. Diabetes mellitus type 2.  4. Hypercholesterolemia.  5. History of detached retina.  6. History of left cerebrovascular accident.   PLAN:  Right VATS wedge resection of right lower lobe lesion and seed  implantation.      Ines Bloomer, M.D.   Electronically Signed     DPB/MEDQ  D:  06/18/2007  T:  06/18/2007  Job:  [id number]

## 2010-11-26 NOTE — Letter (Signed)
June 02, 2007   Kalman Shan, MD  [address] [place] [address]  [place], [place] [address]   Re:  FAOLAN, SPRINGFIELD               DOB:  June 21, 1928   Dear Carmin Muskrat:   I saw [name] today.  This patient was found to have a right  lower lobe lesion and underwent a needle biopsy which showed atypical  cells consistent with possible adenocarcinoma. The lesion looks like an  area where it might have had bronchialalveorlar cancer.  He also has old  granulomas disease and some findings of chronic bronchitis.  His  pulmonary function test showed an FVC of 3.79 with an FPV 1 of 2.84 and  a fusion capacity of 79%.  He has a history of pneumonia in 04/2007. Had  a previous CVA in 2003 which he walks with a cane.  This appears to be  some ground glass appearance.   PAST SURGICAL HISTORY:  His past medical history showed he had a  detached retina in his right eye and has been treated by Dr. Fawn Kirk  and has had several surgeries.  He has also had a previous appendectomy.   ALLERGIES:  No allergies.   MEDICATIONS:  1. Lovastatin 40 mg daily.  2. Tricort 1245 mg daily.  3. Lisinopril/hydrochlorothiazide dose unknown once daily.  4. NovoLog as needed and __________ as needed.   PAST MEDICAL HISTORY:  He has diabetes, hypertension,  hypercholesterolemia.   FAMILY HISTORY:  Negative for vascular disease and pulmonary disease and  cancer.   SOCIAL HISTORY:  He is married, has 3 children.  He is retired.  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  He does not smoke, quit smoking 2 years ago.  Does not  drink alcohol on a regular basis.   REVIEW OF SYSTEMS:  VITALS:  He is 213 pounds.  He is 5 feet 10 inches.  His weight has been stable.  CARDIAC:  No anterior atrial fibrillation.  PULMONARY:  No hemoptysis. He has had some bronchitis in the past.  GI:  No nausea, vomiting, constipation, diarrhea.  GU:  Frequent urination, no  kidney disease.  Vascular, no claudication,  DVT, but has had a stroke 2 years ago with some weakness in his right  leg.  NEUROLOGICAL:  No headaches, black outs or seizures.  MUSCULOSKELETAL:  No arthritis or joint pain.  PSYCHIATRIC:  No psychiatric illnesses.  HEENT:  For his eye see past medical history, no changes in hearing.  HEMATOLOGICAL:  No problems with bleeding, clotting, disorders or  anemia.   PHYSICAL EXAMINATION:  GENERAL: He is a slightly obese Caucasian male.  No acute distress.  His blood pressure is 155/75, pulse 86, respirations  18, sats were 98%.  HEENT: Atraumatic.  Eyes: Decreased eyesight in the right eye.  Pupils  are equal and reactive to light and accommodation.  Exterior ears,  tympanic membranes intact. Nose: There is no septal deviation.  Throat  is without lesion.  NECK:  Supple without thyromegaly.  There is no carotid bruits.  No  supraclavicular or axillary adenopathy.  CHEST:  Clear to auscultation and percussion.  HEART:  Regular sinus rhythm, no murmurs.  ABDOMEN:  Soft, there is no hepatosplenomegaly. Pulses 1+, there is no  clubbing or edema.  NEUROLOGICAL:  He is oriented x3.  Sensory and motor are intact.  I have discussed the situation with him and given his diabetes and his  previous CVA and the size of this lesion, probably the best thing to do  is a wedge resection of the lesion with seed implantation.  I have  referred him to Dr. Margaretmary Bayley for this.  We also plan to get a PET  scan prior to proceeding to surgery.  We have tentatively scheduled the  surgery for 06/22/07 at [facility].   Ines Bloomer, M.D.  Electronically Signed   DPB/MEDQ  D:  06/02/2007  T:  06/03/2007  Job:  [id number]

## 2010-11-26 NOTE — Letter (Signed)
July 13, 2007   Antonio Shan, MD  [address] [place] [address]  [place] [address]   Re:  Antonio, PERSICHETTI               DOB:  Jul 10, 1928   Dear Carmin Muskrat:   I saw [name] today.  He is looking much better.  His blood  pressure was 143/75.  Pulse 93.  Respirations 18.  SATs were 95%.  Incisions were well healed.  Chest x-ray showed further improvement in  his postoperative changes.  I told him to gradually increase his  activities and go back on his antihypertensives.  I will see him back  again in four weeks with a chest x-ray.  I also gave him an appointment  to see Dr. Arbutus Ped for an oncology consultation.   Ines Bloomer, M.D.  Electronically Signed   DPB/MEDQ  D:  07/13/2007  T:  07/13/2007  Job:  [id number]   cc:   Lajuana Matte, MD

## 2010-11-26 NOTE — Op Note (Signed)
NAMESHERMAINE, BRIGHAM               ACCOUNT NO.:  [id number]   MEDICAL RECORD NO.:  [id number]          PATIENT TYPE:  AMB   LOCATION:  SDS                          FACILITY:  [facility]   PHYSICIAN:  Alford Highland. Rankin, M.D.   DATE OF BIRTH:  01-Feb-1928   DATE OF PROCEDURE:  08/23/2007  DATE OF DISCHARGE:                               OPERATIVE REPORT   PREOPERATIVE DIAGNOSES:  1. Tractional retinal detachment, right eye, recurrent.  2. Combined tractional rhegmatogenous retinal detachment, right eye.  3. Proliferative vitreal retinopathy, right eye.  4. Extensive subretinal band formation from previous subretinal      hemorrhages with macular detachment.   POSTOPERATIVE DIAGNOSES:  1. Tractional retinal detachment, right eye, recurrent.  2. Combined  tractional rhegmatogenous retinal detachment, right eye.  3. Proliferative vitreal retinopathy, right eye.  4. Extensive subretinal band formation from previous subretinal      hemorrhages with macular detachment.   PROCEDURE:  1. Posterior vitrectomy with membrane peel, with extraction of      subretinal band via retinotomy.  2. Endolaser pan-photocoagulation to assist in repair of retinal      detachment with fluid-air exchange.  3. Injection of temporary Perfluron to flatten the posterior pole.  4. Removal of the Perfluron, followed by the installation of permanent      silicone oil 5000 centistokes.  5. Removal of silicone oil at the beginning of the procedure, switched      to allow for surgical repair.   SURGEON:  Alford Highland. Rankin, M.D.   ANESTHESIA:  General endotracheal anesthesia.   INDICATIONS FOR PROCEDURE:  The patient is a 77 year old man who had a  previous expulsive choroidal hemorrhage in the right eye which was  temporized with the removal and evacuation of suprachoroidal hemorrhage.  He subsequently developed a subretinal hemorrhage and retinal detachment  which was repaired via a vitrectomy, removal of most of the  subretinal  hemorrhage and reattachment of the retina via a vitrectomy, as well as  placement of silicone oil.  The patient has now developed a combined  tractional rhegmatogenous retinal detachment of the eye with massive  subretinal bands in a multi-plane fashion with a band extending through  the macula as well as inferotemporally as well as supranasally and  nasally.  This is an attempt to remove the traction bands, so as to  allow for enhanced visual functioning.  This will allow for also a  retinal reattachment.   The patient understands the risks of anesthesia and the risks of  surgical repair, including the rare occurrence of death, plus to the  eye, including but not limited to hemorrhage, infection, scarring, need  for further surgery, no change in vision, loss of vision, progression of  disease despite intervention.  He understands there is a grim prognosis  because of the nature of his underlying issue, as well as the subsequent  scarring that has developed.  He understands that this is simply to  salvage the eye.  An appropriate signed consent was obtained.   DESCRIPTION OF PROCEDURE:  The patient was taken to the operating  room.  In the operating room the appropriate monitors followed by general  endotracheal anesthesia.  The right periocular region was sterilely  prepped and draped in the usual ophthalmic fashion.  A lid speculum was  applied.  Conjunctival peritomies were done in a limited fashion in the  three main quadrants, with the exception of inferonasal.  The #20 gauge  infusion cannula was secured 3.5 mm posterior to the limbus.  Placement  in the vitreous cavity verified visually.  Superior sclerotomies were  fashioned.  The microscope was placed in position.  A core vitrectomy  was then begun by removing the silicone oil in place and notable  findings were the traction bands previously mentioned.  Retinotomies  were fashioned along the inferotemporal arcade,  inferonasal arcade and  supranasal arcade, to allow for sectioning of the large traction band in  each quadrant, so as to allow for removal of each band in its own  quadrant, to prevent significant subretinal forces exerted.  Large scars  removed in this fashion.  The largest scar was extracted from the  inferotemporal quadrant using subretinal forceps and forceps to extract  this membrane.  This membrane was underneath the macula.   At this time a fluid-air exchange was partially completed to reattach  the peripheral retina.  Perfluron was then injected over the posterior  pole, to reattach the posterior pole.  This flattened nicely.  Thereafter the fluid-air exchange was carried out further.  So as to  maintain the retinal reattachment, Perfluron was removed and the retina  remained attached.  Endolaser photocoagulation placed 360 degrees.  The  retina remained nicely attached.  At this time an air-silicone oil  exchange was completed. The superior sclerotomy was closed with #7-0  Vicryl.  The infusion removed after an appropriate oil fill was  obtained.  The final sclerotomy was closed with #7-0 Vicryl.  The  conjunctiva closed with #7-0 Vicryl.  Subconjunctival Decadron applied.  A sterile patch and Fox shield applied.   The patient tolerated the procedure without complications.  The patient  was taken to the PACU in good and stable condition.      Alford Highland Rankin, M.D.  Electronically Signed     GAR/MEDQ  D:  08/23/2007  T:  08/24/2007  Job:  [id number]

## 2010-11-26 NOTE — Letter (Signed)
August 12, 2007   Kalman Shan, MD  [address] [place] [address]  [place] [address]   Re:  [name], [name]               DOB:  1928/05/31   Dr. Carmin Muskrat,   I saw Mr. Beals back in the office today.  He is doing well.  His chest  x-ray continues to improve.  He saw Dr. Arbutus Ped, and no further therapy  is indicated.   His chest x-ray shows the seed implants and the coronary ring.  I  explained this all to him in good detail, including there is also a  small stapler tip there too.  All of this should be fine.  I will see  him back again in two months with another chest x-ray.   His lungs were clear to auscultation and percussion.  His blood pressure  was 138/88, pulse 78, respirations 18, sats 96%.   Ines Bloomer, M.D.  Electronically Signed   DPB/MEDQ  D:  08/12/2007  T:  08/13/2007  Job:  [id number]

## 2010-11-26 NOTE — Letter (Signed)
June 30, 2007   Kalman Shan, MD  [address] [place] [address]  [place], [place] [address]   Re:  [name], [name]               DOB:  17-Jun-1928   Dear Dr. Marchelle Gearing:   Mr. Graffam returned today for postop office visit.  We did a  segmentectomy on him including node dissection.  his postoperative  course was benign. He had a 1.9 cm lesion in the superior segment of his  right upper lobe with negative nodes.  His blood pressure was 134/75,  pulse 92, respirations 18, sats were 96%.  While in the hospital he had  some problems with elevated creatinine postoperatively, so we held on  restarting his lisinopril and hydrochlorothiazide.  Base don his wife  today he is doing well overall.  His blood pressure is 134/75, pulse 92,  respirations 18, sats were 95%.  We removed the chest tube sutures and  incisions were healing well.  I told him to start increasing his  activity.  Chest x-ray showed normal postoperative changes.   Plan to see him back in 2 week, at that time if he is stable will  restart his anti hypertension medication.   Sincerely,   Ines Bloomer, M.D.  Electronically Signed   DPB/MEDQ  D:  06/30/2007  T:  07/01/2007  Job:  [id number]   cc:   Artist Pais Kathrynn Running, M.D.  Donia Guiles, M.D.

## 2010-11-26 NOTE — Assessment & Plan Note (Signed)
OFFICE VISIT   [name], [name]  DOB:  02/05/1928                                        October 07, 2007  CHART #:  [id number]   [name] came for followup today.  He is now three months since his  surgery and his chest x-ray is stable.  It showed postoperative changes  with good location of the seen implantation.  His blood pressure is  127/74, pulse 93, respirations 18.  Saturations were 96%.  I will see  him back again in three months after his six month CT.   Ines Bloomer, M.D.  Electronically Signed   DPB/MEDQ  D:  10/07/2007  T:  10/07/2007  Job:  [id number]

## 2010-11-26 NOTE — Op Note (Signed)
NAMECYLER, [name]               ACCOUNT NO.:  [id number]   MEDICAL RECORD NO.:  [id number]          PATIENT TYPE:  AMB   LOCATION:  SDS                          FACILITY:  [facility]   PHYSICIAN:  Alford Highland. Rankin, M.D.   DATE OF BIRTH:  [birth year]   DATE OF PROCEDURE:  DATE OF DISCHARGE:                               OPERATIVE REPORT   PREOPERATIVE DIAGNOSIS:  1. Rhegmatogenous retinal detachment right eye.  2. Vitreous hemorrhage right eye.  3. Subretinal hemorrhage right eye.  4. Age-related macular degeneration largely peripheral scarring      leading to subretinal hemorrhages.  5. Suspected hyperopia.   PROCEDURES:  1. Posterior vitrectomy with Inulase for anticoagulation for      retinopexy and ablated purposes.  2. Membrane peel - subretinal excision of mobilization of subretinal      clots and hemorrhage.  3. Injection of vitreous substitute - silicone 1:5000 centistoke after      gas solution change.  4. Injection of subretinal tissue plasminogen activator to mobilize      the clot hemorrhage.   SURGEON:  Alford Highland. Rankin, M.D.   ANESTHESIA:  General endotracheal anesthesia per patient request.   INDICATIONS FOR PROCEDURE:  Patient is a 77 year old man with bilateral  age-related macular degeneration with peripheral scarring leading to  subretinal hemorrhage of a mild extent in the left eye asymptomatically  and a massive vision loss in the right eye at a level of count fingers  on the basis of multilobular subretinal hemorrhages.  There are no  choroidal hemorrhages.  Patient understands this is an attempt to  mobilize the subretinal hemorrhages and to evacuate subretinal  hemorrhage.  Patient understands this is also an attempt to salvage the  globe in the right eye.  He understands that there is a grim prognosis  for this condition.  Appropriate signed consent was obtained.  The  patient was taken to the operating room.  In the operating room,  appropriate monitoring  was followed by mild sedation.  General  endotracheal anesthesia was achieved without difficulty.  The right  periocular region was sterilely prepped and draped in the usual  ophthalmic fashion.  A lid speculum was applied.  Conjunctival peritomy  fashioned temporally and superonasally.  A 20 gauge infusion cannula was  placed in the infratemporal quadrant.  Placement in the vitreous cavity  verified visually.  Superior sclerotomy was fashioned.  A microscope was  placed in position.  Core vitrectomy was then begun.  It was necessary  to induce a position, induced posterior hyaloid detachment off the  posterior pole and then anterior to the equator 360 degrees.  Scleral  depressor was then used to clear the vitreous in this fashion.  Using a  bent 25 gauge needle and creating a retinotomy inferior to the  infratemporal arcade, proximal to the noted position in the massive  height of the subretinal clot in this area, it appeared to be contiguous  with subretinal hemorrhage and the fovea.  The TPA was injected and this  was allowed to rest for approximately 3 minutes.  Mobilization  of this  area was then carried out with a fluid-fluid exchange and more fluid  placed into this area to mobilize the clot and to disperse the TPA.  At  this time, fluid-fluid exchange completed to mobilize the clot and now  liquefied blood.  This was followed by fluid exchange.  Surprisingly,  all the subfoveal blood cleared without difficulty.  At this time, much  of the remainder of the subretinal hemorrhage was now at the equator and  anterior.  For this reason, decision was made to go ahead and provide  laser photocoagulation in between these areas in an attempt to ablate  the peripheral ischemia retina from this type of border zone type of  ischemic age-related macular degeneration in the periphery.  Subretinal  fibrotic scars were identified, which appeared to be inciting these  areas of hemorrhage.   Posterior pole was walled off with laser  photocoagulation in this fashion.  Laser photocoagulation placed around  the retinotomy site.  Reaccumulated fluid and blood was removed.  An air-  silicone oil exchange carried out passively.  Excellent fill was  obtained.  No complications occurred.  Superior sclerotomy was closed  with 7-0 Vicryl.  The infusion site was closed.  At this time,  conjunctiva was closed with 7-0 Vicryl.  Subconjunctival injection of  Decadron applied.  Sterile patch and Fox shield applied.  Patient  tolerated the procedure without complication.      Alford Highland Rankin, M.D.  Electronically Signed     GAR/MEDQ  D:  03/22/2007  T:  03/23/2007  Job:  [id number]

## 2010-11-29 NOTE — Op Note (Signed)
[place]. [facility]  Patient:    [name], [name] Visit Number: [id number] MRN: [id number]          Service Type: DSU Location: [facility] 2899 25 Attending Physician:  Ivor Messier Dictated by:   Guadelupe Sabin, M.D. Proc. Date: 08/10/01 Admit Date:  08/10/2001 Discharge Date: 08/10/2001   CC:         Desma Maxim, M.D.                           Operative Report  PREOPERATIVE DIAGNOSES: 1. Senile brunescent nuclear cataract, left eye. 2. Status post pars plana vitrectomy, left eye.  POSTOPERATIVE DIAGNOSES: 1. Senile brunescent nuclear cataract, left eye. 2. Status post pars plana vitrectomy, left eye.  NAME OF OPERATION:  Planned extracapsular cataract extraction - phacoemulsification, primary insertion of posterior chamber intraocular lens implant.  SURGEON:  Guadelupe Sabin, M.D.  ASSISTANT:  Nurse.  ANESTHESIA:  Local 4% Xylocaine, 0.75% Marcaine retrobulbar block, topical tetracaine, intraocular Xylocaine.  Anesthesia standby required in this elderly patient.  Patient given sodium pentothal intravenously during the period of retrobulbar injection.  OPERATIVE PROCEDURE:  After the patient was prepped and draped a lid speculum was inserted in the left eye.  Schiotz tonometry was recorded at 7-8 scale units with a 5.5 g weight.  A peritomy was performed adjacent to the limbus from the 11 to 1 oclock position.  The corneoscleral junction was cleaned and a corneoscleral groove made with a 45 degree Superblade.  The anterior chamber was then entered with the 2.5 mm metal keratome at the 12 oclock position and the 15-degree blade at the 2:30 position.  Using a bent 26-gauge needle on a Healon syringe, a circular capsulorrhexis was begun and then completed with the Grabow forceps.  A capsular shred was cut with the Vannas scissors. Hydrodissection and hydrodelineation were performed using 1% Xylocaine.  The brunescent lens nucleus  was then flipped vertically and lens chopping begun from the posterior surface, and phacoemulsification from the anterior surface. Slow phacoemulsification of the rather firm lens nucleus was achieved.  Total ultrasonic time - one minute 37 seconds; average power level - 22%; amount of fluid used - 75 cc.  Following removal of the nucleus, the residual cortex and slight posterior subcapsular plaque was removed with the irrigation aspiration tip.  The posterior capsule appeared intact with brilliant red fundus reflex.  It was therefore elected to insert an [employer] SI40NB three-piece silicon posterior chamber intraocular lens with UV absorber, diopter strength +21.50.  This was inserted with the McDonald forceps into the anterior chamber and then centered into the capsular bag using the [facility] lens rotator.  The lens appeared to be well centered.  The Healon which had been used during the procedure was aspirated and replaced with balanced salt solution and Miochol ophthalmic solution.  The operative incisions appeared to be self-sealing and no sutures were required.  Maxitrol ointment was instilled in the conjunctival cul-de-sac and a light patch and protector shield applied.  Duration of procedure and anesthesia administration - 45 minutes.  Patient tolerated the procedure well in general, left the operating room for the recovery room in good condition. Dictated by:   Guadelupe Sabin, M.D. Attending Physician:  Ivor Messier DD:  08/10/01 TD:  08/10/01 Job: [id number]

## 2010-11-29 NOTE — Op Note (Signed)
[place]. [facility]  Patient:    JAMORIAN, DIMARIA Visit Number: [id number] MRN: [id number]          Service Type: DSU Location: [facility] 2899 25 Attending Physician:  Ivor Messier Dictated by:   Guadelupe Sabin, M.D. Proc. Date: 11/02/01 Admit Date:  11/02/2001 Discharge Date: 11/02/2001   CC:         Desma Maxim, M.D. with any associated laboratory data, ECG, or CXR.                           Operative Report  PREOPERATIVE DIAGNOSES: 1. Senile nuclear cataract, right eye. 2. Diabetic retinopathy, right eye.  POSTOPERATIVE DIAGNOSES: 1. Senile nuclear cataract, right eye. 2. Diabetic retinopathy, right eye.  OPERATION:  Planned extracapsular cataract extraction - phacoemulsification, primary insertion of posterior chamber intraocular lens implant.  SURGEON:  Guadelupe Sabin, M.D.  ASSISTANT:  Nurse.  ANESTHESIA:  Local, 4% Xylocaine, 0.75 Marcaine.  Anesthesia standby required. The patient given sodium pentothal or Ditropan intravenously during the period of retrobulbar blocking.  OPERATIVE PROCEDURE:  After the patient was prepped and draped, a lid speculum was inserted in the right eye.  The eye was turned downward, and superior rectus traction suture placed.  Schiotz tonometry was recorded at 7-8 scale units with a 5.5-gram weight.  A ______ was performed adjacent to the limbus from the 11 to the one oclock position.  The corneoscleral junction was cleaned and a corneoscleral groove made with a 45-degree Superblade.  The anterior chamber was then entered with the 2.5-mm diamond keratome at the 12 oclock position and the 15-degree blade at the 2:30 position.  Using a bent 26-gauge needle on a Healon syringe, a circular capsulorhexis was begun and then completed with the Grabow forceps.  Hydrodissection and hydrodelineation were performed using 1% Xylocaine.  The 30-degree phacoemulsification tip was then inserted and slow,  controlled phacoemulsification of lens nucleus with back chop, lens chopping was performed.  Total ultrasonic time was 1 minute37 seconds, average power level 21%, total amount of fluid used was 90 cc. Following removal of the large brunescent nucleus, the minimal amount of residual cortex was aspirated with the irrigation aspiration tip.  The posterior capsule appeared intact with a brilliant red fundus reflex.  It was therefore elected to insert an [employer] SI-40 and B silicone three-piece posterior chamber intraocular lens implant with UV absorber. Diopter strength +22.50.  This was inserted with the McDonald forceps into the anterior chamber and then centered into the capsular bag using the [facility] lens rotator.  The lens appeared to be well centered.  The Healon which had been used during the procedure was aspirated and replaced with balanced salt solution and Miochol ophthalmic solution.  The operative incisions appeared to be self-sealing, and no sutures were required.  Maxitrol ointment was instilled in the conjunctival cul-de-sac, and a light patch and protector shield applied.  Duration of procedure and anesthesia administration was 45 minutes.  The patient tolerated the procedure well in general and left the operating room for the recovery room in good condition. Dictated by:   Guadelupe Sabin, M.D. Attending Physician:  Ivor Messier DD:  11/02/01 TD:  11/02/01 Job: [id number]

## 2010-11-29 NOTE — H&P (Signed)
NAME:  Antonio, [name] NO.:  [id number]   MEDICAL RECORD NO.:  [id number]          PATIENT TYPE:  EMS   LOCATION:  MAJO                         FACILITY:  [facility]   PHYSICIAN:  Sherin Quarry, MD      DATE OF BIRTH:  [birth year]   DATE OF ADMISSION:  04/27/2005  DATE OF DISCHARGE:                                HISTORY & PHYSICAL   [name] is a 77 year old man with a 60-pack-year smoking history who  states that for about the last 3 weeks he has been feeling tired, weak, and  has had decreased appetite. On Thursday he had an episode of vomiting. He  took some Alka-Seltzer, and his symptoms seemed to get better. Today he  experienced fever with temperature up to 103 degrees associated with chills  and malaise. He noticed that his cough was worse and had an episode of  vomiting. He went to the [facility] where he was seen by Dr.  Arvilla Market. Dr. Arvilla Market evaluated him and then obtained a chest x-ray which  suggested a left lower lobe infiltrate. The patient was, therefore, sent to  [facility] for admission. He currently denies headache. He has no  visual blurring or diplopia. No chest pain, no abdominal pain, no dysuria or  urinary frequency.   PAST MEDICAL HISTORY:   MEDICATIONS:  Currently consist of:  1.  Fish oil capsules.  2.  He also takes TriCor, dose is uncertain.  3.  Lipitor 10 mg daily.  4.  Avandia 88 mg daily.  5.  Plavix 75 mg daily.  6.  Amaryl probably 4 mg daily.  7.  Lisinopril 10 mg daily.  8.  Glucophage 850 mg 3 times a day before meals or food.   ALLERGIES:  He reports he is not allergic to any medications.   OPERATIONS:  He has had an outpatient a cataract procedure an appendectomy.   MEDICAL ILLNESSES:  1.  In November of 2002, the patient experienced onset of slurred speech and      ataxia and was admitted with the presumption of a stroke syndrome. MRI      scan of the brain was performed which showed an acute  infarct in the      posterior limb of the internal capsule on the left. Following his      hospitalization, he was placed on Plavix. He says that he has had no      recurrence of a TIA or stroke symptoms.  2.  Diabetes. The patient has a longstanding history of diabetes which is      extremely well regulated. Blood sugars were consistently in the range of      80-120.  3.  Hypertension. The patient is maintained on an ACE inhibitor for      hypertension.   FAMILY HISTORY:  Noncontributory.   SOCIAL HISTORY:  The patient has a history of smoking about one pack of  cigarettes per day. He does not abuse alcohol. He states that he quit  smoking as of 2 weeks ago.   REVIEW OF SYSTEMS:  HEAD:  He denies headache or dizziness. EYES:  He denies  visual blurring or diplopia. EAR, NOSE, THROAT: Denies earache, sinus pain  or sore throat. CHEST:  He has a chronic nonproductive cough. He denies  chest pain. He denies shortness of breath. CARDIOVASCULAR:  Denies  orthopnea, PND or ankle edema. GI: He is nauseated. He had one episode of  vomiting today. He has no abdominal pain. GU: Denies dysuria or urinary  frequency. NEUROLOGIC:  There is no history of seizure, otherwise see above.  ENDOCRINE: See above.   PHYSICAL EXAMINATION:  VITAL SIGNS: Blood pressure is 150/60, temperature is  103, pulse is 90 and regular.  HEENT: Exam is within normal limits.  CHEST: There are rales at the left base.  CARDIOVASCULAR:  Reveals normal S1-S2 without gross murmurs or gallops.  ABDOMEN: Benign with normal bowel sounds without masses or tenderness.  NEUROLOGIC:  Normal.  EXTREMITIES:  Normal.   IMPRESSION:  1.  Probable left lower lobe pneumonia versus bronchitis.  2.  Nausea, vomiting, and dehydration.  3.  Diabetes.  4.  History of stroke.  5.  Hyperlipidemia.  6.  History of cataracts.   PLAN:  1.  The patient will be admitted for IV antibiotics.  2.  Will get a follow-up chest x-ray in the  morning after he is hydrated.  3.  Will get a CBC, BMET, and urinalysis as well as A1c.  4.  Will follow his status closely.           ______________________________  Sherin Quarry, MD     SY/MEDQ  D:  04/27/2005  T:  04/27/2005  Job:  [id number]   cc:   Donia Guiles, M.D.  Fax: [phone]

## 2010-11-29 NOTE — H&P (Signed)
[place]. [facility]  Patient:    [name], [name] Visit Number: [id number] MRN: [id number]          Service Type: DSU Location: [facility] 2899 25 Attending Physician:  Ivor Messier Dictated by:   Guadelupe Sabin, M.D. Admit Date:  11/02/2001 Discharge Date: 11/02/2001   CC:         Desma Maxim, M.D. with any associated laboratory data, ECG, or CXR.                         History and Physical  OUTPATIENT NOTE:  This was a planned outpatient surgical readmission of this 77 year old white male, admitted for cataract implant surgery of the right eye.  PRESENT ILLNESS:  This patient has a long history of non-insulin-dependent diabetes mellitus and secondary complications of diabetic retinopathy, and cataract formation.  He has undergo previous laser photocoagulation to both eyes, and on February 19, 1999, pars plana vitrectomy of the left eye with endolaser photocoagulation and/or membrane peeling.  On August 10, 2001, cataract implant surgery was performed on the left eye.  Recently there has been a marked reduction in vision in the right eye felt due to his increasing nuclear cataract formation.  When vision reached 20/200 the patient elected to proceed with cataract implant surgery, noting blurred vision and difficulty doing many of his household tasks.  He signed an informed consent and arrangements were made for his outpatient readmission.  PAST MEDICAL HISTORY:  The patient continues in stable general health under the care of Dr. Desma Maxim.  The patient has been taking Plavix but has discontinued them a few days prior to surgery.  He has felt by Dr. Arvilla Market to be in stable, good general health, taking Glucophage, Avandia, and other medications.  REVIEW OF SYSTEMS:  No cardiorespiratory complaints.  PHYSICAL EXAMINATION:  GENERAL:  The patient is a pleasant, well-developed, well nourished, white male in no acute  distress.  VITAL SIGNS:  As recorded on admission, blood pressure 149/80, respirations 16, heart rate 88, temperature 97.1 degrees.  HEENT:  Eyes:  Visual acuity as noted above.  Applanation tonometry is normal, 15 mm left eye.  Right eye:  Normal.  Slit lamp examination:  The eyes are white and clear with dense nuclear cataract formation, right eye. Pseudophakic left eye with a clear well-centered, intraocular lens implant, and visual acuity of 20/20.  Detailed fundus examination reveals a clear vitreous, attached retina, with old background diabetic retinopathy.  CHEST:  Lungs are clear to percussion and auscultation.  HEART:  Normal sinus rhythm.  No cardiomegaly.  No murmurs.  ABDOMEN:  Negative.  EXTREMITIES:  Negative.  ADMISSION DIAGNOSES: 1. Senile cataract, right eye. 2. Diabetic retinopathy, right eye. 2. Status post laser photocoagulation.  SURGICAL PLAN:  Cataract implant surgery, right eye. Dictated by:   Guadelupe Sabin, M.D. Attending Physician:  Ivor Messier DD:  11/02/01 TD:  11/02/01 Job: [id number]

## 2010-11-29 NOTE — H&P (Signed)
[place]. [facility]  Patient:    [name], [name] Visit Number: [id number] MRN: [id number]          Service Type: DSU Location: [facility] 2899 25 Attending Physician:  Ivor Messier Dictated by:   Guadelupe Sabin, M.D. Admit Date:  08/10/2001   CC:         Desma Maxim, M.D., with lab data, EKG, and chest x-ray                         History and Physical  REASON FOR ADMISSION:  This was a planned outpatient surgical admission of this 77 year old white male non-insulin-dependent diabetic admitted for cataract implant surgery of the left eye.  PRESENT ILLNESS:  This patient has been followed in my office since 2000.  At that time he was referred by Dr. Donia Guiles, his regular physician, with a dense acute vitreous hemorrhage of the left eye which had not cleared.  After evaluation it was felt that vitrectomy surgery was indicated and the patient was admitted to this hospital on February 19, 1999 for a pars plana vitrectomy with endolaser photocoagulation.  The patient tolerated the procedure well and there has been no further bleeding.  The patient has gradually been noted to have increasing nuclear cataract formation in both eyes - greater in the left than right eye.  This has hampered his activities and quality of life and the patient has now elected to proceed with cataract implant surgery.  His vision had dropped to 20/50 right eye, 20/50- left eye.  Authorization was provided by [facility] plan.  He signed an informed consent and arrangements were made for his outpatient admission at this time.  PAST MEDICAL HISTORY:  Patient continues under Dr. Arvilla Market, taking Glucophage, Amaryl, and Zestril.  He is felt to be in a stable condition at the present time for the proposed surgery.  REVIEW OF SYSTEMS:  No cardiorespiratory complaints.  PHYSICAL EXAMINATION:  VITAL SIGNS:  As recorded on admission, blood pressure 142/80, pulse  90, respirations 18, temperature 97.1.  GENERAL APPEARANCE:  Patient is a pleasant, well-nourished, well-developed 77 year old white male diabetic in no acute distress.  HEENT:  Eyes:  Visual acuity as noted above.  Slit lamp examination revealed bilateral brunescent nuclear cataract formation.  Detailed fundus examination reveals a clear vitreous, attached retina.  No active diabetic retinopathy is present.  CHEST:  Lungs clear to percussion and auscultation.  HEART:  Normal sinus rhythm, no cardiomegaly, no murmurs.  ABDOMEN:  Negative.  EXTREMITIES:  Negative.  ADMISSION DIAGNOSES: 1. Senile cataract, left eye. 2. Status post pars plana vitrectomy, left eye.  SURGICAL PLAN:  Cataract implant surgery - left eye. Dictated by:   Guadelupe Sabin, M.D. Attending Physician:  Ivor Messier DD:  08/10/01 TD:  08/10/01 Job: [id number]

## 2010-11-29 NOTE — H&P (Signed)
[name]. [facility]  Patient:    [name], [name] Visit Number: [id number] MRN: [id number]          Service Type: MED Location: 1800 1828 01 Attending Physician:  Pearletha Alfred Dictated by:   Viviana Simpler, M.D. Admit Date:  05/29/2001   CC:         Desma Maxim, M.D.   History and Physical  DATE OF BIRTH:  10/04/1927  CHIEF COMPLAINT:  "I think I had a mini-stroke."  HISTORY OF PRESENT ILLNESS:  [name] is a 77 year old man with longstanding diabetes and tobacco abuse, with no prior history of stroke or coronary artery disease to his knowledge.  He was well until he awoke this morning, experiencing a "thick tongue" with difficulty speaking, and felt somewhat staggery.  He ate breakfast without difficulty in swallowing or coordination of arm and hand movements, and decided to go back to bed (which is his usual routine).  When he awoke, he felt better, thought the symptoms had passed, but they returned.  His wife contacted the physician on call and shared her concern about the possibility of stroke.  Against [name] wishes, she talked him into coming to the emergency department but refused to come by ambulance.  [name] is a very stoic and stubborn individual.  Initially, he was not particularly forthcoming with history, feeling hounded by his wife and ER staff, wanting to go home.  He denies headache, blurred vision, double vision, dysphagia, focal numbness or weakness, uncoordination except for the staggering noted with gait earlier, loss of bowel or bladder.  He has never had such symptoms before.  Review of systems is otherwise notable for reflux symptoms, worse during night and supine position during the day.  He does not modify his diet accordingly.  His wife explains that Mr. Granholm is fairly sedentary.  If he does "walk for exercise," it is usually an "amble," with a quarter of a mile taking up to 30 minutes.  He  does not follow a diet, demands that his wife fry his food, refusing to eat anything boiled, grilled or broiled.  He has not had formal diabetes counseling and does not think he needs this.  He does not monitor his blood sugar regularly.  His weight has been apparently stable.  PAST MEDICAL HISTORY: 1. Diabetes mellitus, diagnosed 1986 (history of retinopathy and retinal    hemorrhage, neuropathy of lower extremities). 2. History of appendectomy. 3. History of GERD by symptom report.  MEDICATIONS:  Zestril, Amaryl, Glucophage, aspirin, doses not recalled.  SOCIAL HISTORY:  Patient is married and retired.  He smokes tobacco heavily and has since an early age.  He is largely sedentary.  His greatest happiness comes from visits from his 44-year-old grandson.  FAMILY HISTORY:  "My daddy smoked his whole life and died an old man with no problems."  No other notable illnesses per his report.  PHYSICAL EXAMINATION:  VITAL SIGNS:  Blood pressure 160/74, pulse 90s, pulse oximetry pending.  GENERAL:  He is an overweight, deconditioned man in no acute distress, lying supine on a gurney.  Ruddy complexion.  Awake and alert and appropriate, cooperative with examination and history.  HEENT:  Oropharynx noted for upper and lower dentures; erythematous macule of the left soft palate of questionable duration and significance; mouth is moist with tobacco stains of the tongue.  NECK:  There is 3-cm JVD above the sternal border in supine position, resolves at 45 degrees.  There are no carotid bruits.  LUNGS:  Clear to auscultation with diminished sounds generally, scattered rhonchi.  Nonlabored respirations.  HEART:  Regular rhythm with no significant murmur, rub or gallop.  ABDOMEN:  Obese, soft, nontender, with normoactive bowel sounds.  The bladder is decompressed by palpation.  EXTREMITIES:  Lower extremities are notable for 1+ edema of the right, absent edema on the left.  The right foot  is warm to touch with 2+ dorsalis pedis pulse, whereas the left is cool to touch with a very faint pulse.  He has decreased sensation over the feet in a stocking distribution.  His right toe is missing the nail, having spontaneously fallen off about a week ago.  The skin is open on the nail bed with surrounding erythema and evidence of dried exudate.  I cannot extrude pus and do not see localized abscess.  NEUROLOGIC:  The face is symmetric except for a very slight facial droop at the right mouth.  His eyes reveal extraocular movements intact.  His pupils are 2 mm and reactive to light.  The fundi are flat.  I do not observe exudates suggestive of retinopathy but there is some microvascularization. Visual fields are full by confrontation.  The gag is intact bilaterally, tongue is midline and sensation is full bilaterally.  He reveals good motor strength in all major muscle groups of the extremities.  His sensation is full except for decrease in stocking pattern over the feet as documented above.  He exhibits accurate finger-to-nose and rapid repeating movements bilaterally. There is no pronator drift.  His gait is not assessed.  Speech is somewhat thick, although not particularly dysarthric.  There is only slightly perceptible slurring with Ss.  LABORATORY DATA:  Head CT pending.  Sodium 137, potassium 3.9, chloride 104, CO2 26, glucose 110, BUN 10, creatinine 1, albumin 3.7, SGOT 31, SGPT 27, alkaline phosphatase 44. Hemoglobin 14.4, MCV 96.9, white blood cell count 7.3.  EKG is pending.  ASSESSMENT AND PLAN: 1. [name] appears to be suffering a cerebrovascular accident of stuttering    characteristics, expected at this point to be small vessel/lacunar.  Will    obtain MRI/MRA to clarify, and initiate intravenous heparin at this time.    Most likely etiology is continued tobacco abuse with diabetes and high-fat    diet.  Lipid panel will be obtained, as will hemoglobin A1c, and  modifiable     risk factors discussed (although I foresee this will be a challenge). 2. Diminished pulse, left foot.  Will obtain ankle brachial indices but    suspect he is developing peripheral vascular disease. 3. Infected right toenail bed.  We will soak the toe with Hibiclens and wrap    with wet-to-dry dressings.  Will provide topical antibiotic but may yet    need systemic.  At this point, there is no extensive cellulitis nor    abscess.  I may yet need to have a surgeon examine for further possible    debridement. 4. Type 2 diabetes mellitus.  Will continue his home medicines and adjust.  I    will suggest outpatient therapy, although his wife states that he is most    likely to decline.  Perhaps todays symptoms will be a wake-up call for    opportunity to modify some risk behaviors.  For now, he will be maintained    on a low-fat 1800 American Diabetic Association diet, which I am sure he    will thoroughly enjoy. Dictated by:  Viviana Simpler, M.D. Attending Physician:  Pearletha Alfred DD:  05/29/01 TD:  05/30/01 Job: [id number]

## 2011-04-04 LAB — BASIC METABOLIC PANEL
CO2: 28
Chloride: 103
Creatinine, Ser: 1.42
GFR calc Af Amer: 58 — ABNORMAL LOW
Glucose, Bld: 122 — ABNORMAL HIGH

## 2011-04-04 LAB — CBC
Hemoglobin: 12.7 — ABNORMAL LOW
MCHC: 33.8
MCV: 94.7
RBC: 3.98 — ABNORMAL LOW

## 2011-04-18 LAB — BUN: BUN: 20 mg/dL (ref 6–23)

## 2011-04-21 LAB — COMPREHENSIVE METABOLIC PANEL
Albumin: 2.7 — ABNORMAL LOW
Alkaline Phosphatase: 61
BUN: 16
BUN: 26 — ABNORMAL HIGH
Chloride: 104
Creatinine, Ser: 1.67 — ABNORMAL HIGH
Glucose, Bld: 100 — ABNORMAL HIGH
Glucose, Bld: 181 — ABNORMAL HIGH
Potassium: 3.7
Total Bilirubin: 0.6
Total Bilirubin: 0.8
Total Protein: 6

## 2011-04-21 LAB — BLOOD GAS, ARTERIAL
Bicarbonate: 24
Bicarbonate: 25.5 — ABNORMAL HIGH
FIO2: 0.21
O2 Saturation: 93.3
pCO2 arterial: 43.1
pH, Arterial: 7.411
pO2, Arterial: 68.2 — ABNORMAL LOW
pO2, Arterial: 79.7 — ABNORMAL LOW

## 2011-04-21 LAB — TYPE AND SCREEN: ABO/RH(D): A POS

## 2011-04-21 LAB — BASIC METABOLIC PANEL
BUN: 20
BUN: 22
CO2: 25
CO2: 28
Chloride: 100
Chloride: 102
GFR calc Af Amer: >60
GFR calc non Af Amer: 50 — ABNORMAL LOW
GFR calc non Af Amer: 54 — ABNORMAL LOW
Glucose, Bld: 101 — ABNORMAL HIGH
Glucose, Bld: 170 — ABNORMAL HIGH
Potassium: 3.4 — ABNORMAL LOW
Potassium: 3.8
Potassium: 3.8
Sodium: 133 — ABNORMAL LOW
Sodium: 134 — ABNORMAL LOW

## 2011-04-21 LAB — CBC
HCT: 33.8 — ABNORMAL LOW
HCT: 33.8 — ABNORMAL LOW
HCT: 37.3 — ABNORMAL LOW
Hemoglobin: 11.5 — ABNORMAL LOW
Hemoglobin: 11.5 — ABNORMAL LOW
Hemoglobin: 12.6 — ABNORMAL LOW
MCV: 97.3
Platelets: 184
RBC: 3.49 — ABNORMAL LOW
RDW: 12.9
WBC: 10.7 — ABNORMAL HIGH
WBC: 7

## 2011-04-21 LAB — URINALYSIS, ROUTINE W REFLEX MICROSCOPIC
Ketones, ur: NEGATIVE
Leukocytes, UA: NEGATIVE
Nitrite: NEGATIVE
Specific Gravity, Urine: 1.017
pH: 6

## 2011-04-21 LAB — PROTIME-INR
INR: 1
Prothrombin Time: 13.1

## 2011-04-21 LAB — URINE MICROSCOPIC-ADD ON

## 2011-04-23 LAB — CBC
HCT: 39.4
Hemoglobin: 13.3
MCV: 96.8
Platelets: 201
WBC: 6.7

## 2011-04-23 LAB — GRAM STAIN

## 2011-04-25 LAB — BASIC METABOLIC PANEL
CO2: 28
Calcium: 9.5
Creatinine, Ser: 1.28
GFR calc Af Amer: >60
GFR calc non Af Amer: 55 — ABNORMAL LOW
Sodium: 138

## 2011-04-25 LAB — CBC
Hemoglobin: 13.2
MCHC: 33.8
RBC: 4.03 — ABNORMAL LOW
WBC: 6.4

## 2011-06-30 ENCOUNTER — Ambulatory Visit (HOSPITAL_COMMUNITY)
Admission: RE | Admit: 2011-06-30 | Discharge: 2011-06-30 | Disposition: A | Discharge status: Home / Self Care | Payer: Medicare Other | Source: Ambulatory Visit | Attending: Internal Medicine | Admitting: Internal Medicine

## 2011-06-30 ENCOUNTER — Other Ambulatory Visit (HOSPITAL_BASED_OUTPATIENT_CLINIC_OR_DEPARTMENT_OTHER): Payer: Medicare Other | Admitting: Lab

## 2011-06-30 ENCOUNTER — Other Ambulatory Visit: Payer: Self-pay | Admitting: Internal Medicine

## 2011-06-30 DIAGNOSIS — I2510 Atherosclerotic heart disease of native coronary artery without angina pectoris: Secondary | ICD-10-CM | POA: Insufficient documentation

## 2011-06-30 DIAGNOSIS — R0602 Shortness of breath: Secondary | ICD-10-CM | POA: Insufficient documentation

## 2011-06-30 DIAGNOSIS — C3430 Malignant neoplasm of lower lobe, unspecified bronchus or lung: Secondary | ICD-10-CM

## 2011-06-30 DIAGNOSIS — J984 Other disorders of lung: Secondary | ICD-10-CM | POA: Insufficient documentation

## 2011-06-30 DIAGNOSIS — C3490 Malignant neoplasm of unspecified part of unspecified bronchus or lung: Secondary | ICD-10-CM

## 2011-06-30 DIAGNOSIS — N62 Hypertrophy of breast: Secondary | ICD-10-CM | POA: Insufficient documentation

## 2011-06-30 DIAGNOSIS — M899 Disorder of bone, unspecified: Secondary | ICD-10-CM | POA: Insufficient documentation

## 2011-06-30 LAB — CBC WITH DIFFERENTIAL/PLATELET
Basophils Absolute: 0 10*3/uL (ref 0.0–0.1)
Eosinophils Absolute: 0.3 10*3/uL (ref 0.0–0.5)
HGB: 12 g/dL — ABNORMAL LOW (ref 13.0–17.1)
LYMPH%: 13.9 % — ABNORMAL LOW (ref 14.0–49.0)
MCV: 96.3 fL (ref 79.3–98.0)
MONO#: 0.5 10*3/uL (ref 0.1–0.9)
MONO%: 6.9 % (ref 0.0–14.0)
NEUT#: 5.9 10*3/uL (ref 1.5–6.5)
Platelets: 190 10*3/uL (ref 140–400)
RBC: 3.72 10*6/uL — ABNORMAL LOW (ref 4.20–5.82)
WBC: 7.8 10*3/uL (ref 4.0–10.3)

## 2011-06-30 LAB — COMPREHENSIVE METABOLIC PANEL
Albumin: 3.9 g/dL (ref 3.5–5.2)
BUN: 33 mg/dL — ABNORMAL HIGH (ref 6–23)
CO2: 25 mEq/L (ref 19–32)
Glucose, Bld: 175 mg/dL — ABNORMAL HIGH (ref 70–99)
Potassium: 4.5 mEq/L (ref 3.5–5.3)
Sodium: 133 mEq/L — ABNORMAL LOW (ref 135–145)
Total Bilirubin: 0.3 mg/dL (ref 0.3–1.2)
Total Protein: 7 g/dL (ref 6.0–8.3)

## 2011-07-02 ENCOUNTER — Encounter: Payer: Self-pay | Admitting: Internal Medicine

## 2011-07-02 ENCOUNTER — Telehealth: Payer: Self-pay | Admitting: Internal Medicine

## 2011-07-02 ENCOUNTER — Ambulatory Visit (HOSPITAL_BASED_OUTPATIENT_CLINIC_OR_DEPARTMENT_OTHER): Payer: Medicare Other | Admitting: Internal Medicine

## 2011-07-02 VITALS — BP 114/59 | HR 83 | Temp 97.3°F | Wt 211.3 lb

## 2011-07-02 DIAGNOSIS — C3490 Malignant neoplasm of unspecified part of unspecified bronchus or lung: Secondary | ICD-10-CM

## 2011-07-02 DIAGNOSIS — C3431 Malignant neoplasm of lower lobe, right bronchus or lung: Secondary | ICD-10-CM | POA: Insufficient documentation

## 2011-07-02 NOTE — Telephone Encounter (Signed)
appts made and printed for pt for 06/2012,aom

## 2011-07-02 NOTE — Progress Notes (Signed)
[facility] OFFICE PROGRESS NOTE   DIAGNOSIS: Stage IA (T1N0M0) non-small cell lung cancer, adenocarcinoma, diagnosed in October 2008.  PRIOR THERAPY: Status post right lower lobe superior segmentectomy with seed implants under the care of Dr Edwyna Shell on June 22, 2007.  CURRENT THERAPY: Observation.  INTERVAL HISTORY: [name] 77 y.o. male returns to the clinic today for an annual followup visit accompanied by his wife. The patient is feeling fine today. He has no specific complaints except for shortness of breath with exertion. He has no chest pain, no cough, no hemoptysis. He has no weight loss or night sweats. He has repeat CT scan of the chest performed recently and he is here for evaluation and discussion of his scan results.  MEDICAL HISTORY: Past Medical History  Diagnosis Date  . Diabetes mellitus   . Hypertension   . Hypercholesteremia   . Stroke   . PNA (pneumonia)   . Detached retina, right     ALLERGIES:   has no known allergies.  MEDICATIONS:  Current Outpatient Prescriptions  Medication Sig Dispense Refill  . Fenofibrate (TRICOR PO) Take 134 mg by mouth daily.        . fish oil-omega-3 fatty acids 1000 MG capsule Take 1 g by mouth daily.        . insulin detemir (LEVEMIR) 100 UNIT/ML injection Inject 100 Units into the skin at bedtime.        . insulin lispro (HUMALOG) 100 UNIT/ML injection Inject 55 Units into the skin 3 (three) times daily before meals. 10 with breakfast, 20units with lunch, 25u with dinner       . lisinopril-hydrochlorothiazide (PRINZIDE,ZESTORETIC) 10-12.5 MG per tablet Take 1 tablet by mouth daily.        Marland Kitchen lovastatin (MEVACOR) 40 MG tablet Take 40 mg by mouth at bedtime.          SURGICAL HISTORY:  Past Surgical History  Procedure Date  . Appendectomy     REVIEW OF SYSTEMS:  A comprehensive review of systems was negative except for: Respiratory: positive for dyspnea on exertion   PHYSICAL EXAMINATION: General  appearance: alert, cooperative and no distress Head: Normocephalic, without obvious abnormality, atraumatic Neck: no adenopathy Lymph nodes: Cervical, supraclavicular, and axillary nodes normal. Resp: clear to auscultation bilaterally Cardio: regular rate and rhythm, S1, S2 normal, no murmur, click, rub or gallop GI: soft, non-tender; bowel sounds normal; no masses,  no organomegaly Extremities: extremities normal, atraumatic, no cyanosis or edema Neurologic: Alert and oriented X 3, normal strength and tone. Normal symmetric reflexes. Normal coordination and gait  ECOG PERFORMANCE STATUS: 1 - Symptomatic but completely ambulatory  Blood pressure 114/59, pulse 83, temperature 97.3 F (36.3 C), temperature source Oral, weight 211 lb 4.8 oz (95.845 kg).  LABORATORY DATA: Lab Results  Component Value Date   WBC 7.8 06/30/2011   HGB 12.0* 06/30/2011   HCT 35.8* 06/30/2011   MCV 96.3 06/30/2011   PLT 190 06/30/2011      Chemistry      Component Value Date/Time   NA 133* 06/30/2011 1442   K 4.5 06/30/2011 1442   CL 101 06/30/2011 1442   CO2 25 06/30/2011 1442   BUN 33* 06/30/2011 1442   CREATININE 2.25* 06/30/2011 1442      Component Value Date/Time   CALCIUM 9.3 06/30/2011 1442   ALKPHOS 62 06/30/2011 1442   AST 23 06/30/2011 1442   ALT 18 06/30/2011 1442   BILITOT 0.3 06/30/2011 1442  RADIOGRAPHIC STUDIES: Ct Chest Wo Contrast  06/30/2011  *RADIOLOGY REPORT*  Clinical Data: Lung cancer.  Shortness of breath.  CT CHEST WITHOUT CONTRAST  Technique:  Multidetector CT imaging of the chest was performed following the standard protocol without IV contrast.  Comparison: 07/01/2010.  Findings: No pathologically enlarged mediastinal or axillary lymph nodes.  Hilar regions are difficult to definitively evaluate without IV contrast.  Bilateral gynecomastia.  Coronary artery calcification.  Heart size normal.  No pericardial effusion.  There are postoperative changes of  radioactive seed placement in the right hemithorax.  Subpleural densities in the lateral segment right middle lobe are unchanged.  There is fairly diffuse peribronchovascular nodularity, stable and possibly post infectious in etiology.  No pleural fluid.  Airway is otherwise unremarkable.  Incidental imaging of the upper abdomen shows porta hepatis lymph nodes measuring up to 12 mm, as before.  Scattered lucent lesions in the mid thoracic spine are unchanged from 01/04/2008. Degenerative changes are seen in the spine as well.  IMPRESSION: Postoperative changes and radioactive seed placement in the right hemithorax without evidence of recurrent or metastatic disease.  Original Report Authenticated By: Reyes Ivan, M.D.    ASSESSMENT: This is a very pleasant 77 years old white male with history of stage IA non-small cell lung cancer diagnosed in October of 2008 status post right lower lobe superior segmentectomy. The patient is doing fine and he has no evidence for disease recurrence.  PLAN: I recommended for the patient continuous observation for now. I would see him back for followup visit in one year with repeat CT scan of the chest. He was advised to resume with has any concerning symptoms in the interval.  All questions were answered. The patient knows to call the clinic with any problems, questions or concerns. We can certainly see the patient much sooner if necessary.

## 2011-09-09 ENCOUNTER — Other Ambulatory Visit: Payer: Self-pay | Admitting: Dermatology

## 2011-11-12 ENCOUNTER — Other Ambulatory Visit: Payer: Self-pay | Admitting: Dermatology

## 2012-06-01 ENCOUNTER — Other Ambulatory Visit: Payer: Self-pay | Admitting: Dermatology

## 2012-06-28 ENCOUNTER — Other Ambulatory Visit (HOSPITAL_BASED_OUTPATIENT_CLINIC_OR_DEPARTMENT_OTHER): Payer: Medicare Other | Admitting: Lab

## 2012-06-28 ENCOUNTER — Ambulatory Visit (HOSPITAL_COMMUNITY)
Admission: RE | Admit: 2012-06-28 | Discharge: 2012-06-28 | Disposition: A | Discharge status: Home / Self Care | Payer: Medicare Other | Source: Ambulatory Visit | Attending: Internal Medicine | Admitting: Internal Medicine

## 2012-06-28 DIAGNOSIS — I2510 Atherosclerotic heart disease of native coronary artery without angina pectoris: Secondary | ICD-10-CM | POA: Insufficient documentation

## 2012-06-28 DIAGNOSIS — C3490 Malignant neoplasm of unspecified part of unspecified bronchus or lung: Secondary | ICD-10-CM | POA: Insufficient documentation

## 2012-06-28 LAB — COMPREHENSIVE METABOLIC PANEL (CC13)
Alkaline Phosphatase: 65 U/L (ref 40–150)
CO2: 23 mEq/L (ref 22–29)
Creatinine: 2.2 mg/dL — ABNORMAL HIGH (ref 0.7–1.3)
Glucose: 153 mg/dl — ABNORMAL HIGH (ref 70–99)
Sodium: 137 mEq/L (ref 136–145)
Total Bilirubin: 0.32 mg/dL (ref 0.20–1.20)
Total Protein: 6.6 g/dL (ref 6.4–8.3)

## 2012-06-28 LAB — CBC WITH DIFFERENTIAL/PLATELET
Basophils Absolute: 0 10*3/uL (ref 0.0–0.1)
Eosinophils Absolute: 0.4 10*3/uL (ref 0.0–0.5)
HGB: 11.9 g/dL — ABNORMAL LOW (ref 13.0–17.1)
MONO#: 0.5 10*3/uL (ref 0.1–0.9)
NEUT#: 4.7 10*3/uL (ref 1.5–6.5)
RBC: 3.66 10*6/uL — ABNORMAL LOW (ref 4.20–5.82)
RDW: 13.3 % (ref 11.0–14.6)
WBC: 6.8 10*3/uL (ref 4.0–10.3)

## 2012-06-30 ENCOUNTER — Ambulatory Visit (HOSPITAL_BASED_OUTPATIENT_CLINIC_OR_DEPARTMENT_OTHER): Payer: Medicare Other | Admitting: Internal Medicine

## 2012-06-30 ENCOUNTER — Encounter: Payer: Self-pay | Admitting: Internal Medicine

## 2012-06-30 ENCOUNTER — Telehealth: Payer: Self-pay | Admitting: Internal Medicine

## 2012-06-30 VITALS — BP 161/81 | HR 87 | Temp 97.0°F | Resp 20 | Ht 70.0 in | Wt 214.2 lb

## 2012-06-30 DIAGNOSIS — C3490 Malignant neoplasm of unspecified part of unspecified bronchus or lung: Secondary | ICD-10-CM

## 2012-06-30 DIAGNOSIS — C3430 Malignant neoplasm of lower lobe, unspecified bronchus or lung: Secondary | ICD-10-CM

## 2012-06-30 NOTE — Progress Notes (Signed)
[facility] Telephone:([phone]   Fax:([phone]  OFFICE PROGRESS NOTE  SHAW,KIMBERLEE, MD [address] [place] [address]  DIAGNOSIS: Stage IA (T1N0M0) non-small cell lung cancer, adenocarcinoma, diagnosed in October 2008.   PRIOR THERAPY: Status post right lower lobe superior segmentectomy with seed implants under the care of Dr Edwyna Shell on June 22, 2007.   CURRENT THERAPY: Observation.  INTERVAL HISTORY: [name] 78 y.o. male returns to the clinic today for routine annual followup visit accompanied his wife. The patient is feeling fine today with no specific complaints except for shortness breath with exertion. He denied having any significant chest pain, cough or hemoptysis. He denied having any significant weight loss or night sweats. The patient had repeat CT scan of the chest performed recently and he is here for evaluation and discussion of his scan results.  MEDICAL HISTORY: Past Medical History  Diagnosis Date  . Diabetes mellitus   . Hypertension   . Hypercholesteremia   . Stroke   . PNA (pneumonia)   . Detached retina, right     ALLERGIES:   has no known allergies.  MEDICATIONS:  Current Outpatient Prescriptions  Medication Sig Dispense Refill  . Fenofibrate (TRICOR PO) Take 134 mg by mouth daily.        . fish oil-omega-3 fatty acids 1000 MG capsule Take 1 g by mouth daily.        . insulin detemir (LEVEMIR) 100 UNIT/ML injection Inject 100 Units into the skin at bedtime.       . insulin lispro (HUMALOG) 100 UNIT/ML injection Inject 55 Units into the skin 3 (three) times daily before meals. 10 with breakfast, 20units with lunch, 25u with dinner      . lisinopril-hydrochlorothiazide (PRINZIDE,ZESTORETIC) 10-12.5 MG per tablet Take 1 tablet by mouth daily.        Marland Kitchen lovastatin (MEVACOR) 40 MG tablet Take 40 mg by mouth at bedtime.        . fluorouracil (EFUDEX) 5 % cream       . ONE TOUCH ULTRA TEST test strip          SURGICAL HISTORY:  Past Surgical History  Procedure Date  . Appendectomy     REVIEW OF SYSTEMS:  A comprehensive review of systems was negative except for: Respiratory: positive for dyspnea on exertion   PHYSICAL EXAMINATION: General appearance: alert, cooperative and no distress Head: Normocephalic, without obvious abnormality, atraumatic Neck: no adenopathy Lymph nodes: Cervical, supraclavicular, and axillary nodes normal. Resp: clear to auscultation bilaterally Cardio: regular rate and rhythm, S1, S2 normal, no murmur, click, rub or gallop GI: soft, non-tender; bowel sounds normal; no masses,  no organomegaly Extremities: extremities normal, atraumatic, no cyanosis or edema  ECOG PERFORMANCE STATUS: 1 - Symptomatic but completely ambulatory  Blood pressure 161/81, pulse 87, temperature 97 F (36.1 C), temperature source Oral, resp. rate 20, height 5\' 10"  (1.778 m), weight 214 lb 3.2 oz (97.16 kg).  LABORATORY DATA: Lab Results  Component Value Date   WBC 6.8 06/28/2012   HGB 11.9* 06/28/2012   HCT 35.5* 06/28/2012   MCV 97.0 06/28/2012   PLT 170 06/28/2012      Chemistry      Component Value Date/Time   NA 137 06/28/2012 1358   NA 133* 06/30/2011 1442   K 4.1 06/28/2012 1358   K 4.5 06/30/2011 1442   CL 105 06/28/2012 1358   CL 101 06/30/2011 1442   CO2  23 06/28/2012 1358   CO2 25 06/30/2011 1442   BUN 31.0* 06/28/2012 1358   BUN 33* 06/30/2011 1442   CREATININE 2.2* 06/28/2012 1358   CREATININE 2.25* 06/30/2011 1442      Component Value Date/Time   CALCIUM 8.6 06/28/2012 1358   CALCIUM 9.3 06/30/2011 1442   ALKPHOS 65 06/28/2012 1358   ALKPHOS 62 06/30/2011 1442   AST 26 06/28/2012 1358   AST 23 06/30/2011 1442   ALT 20 06/28/2012 1358   ALT 18 06/30/2011 1442   BILITOT 0.32 06/28/2012 1358   BILITOT 0.3 06/30/2011 1442       RADIOGRAPHIC STUDIES: Ct Chest Wo Contrast  06/28/2012  *RADIOLOGY REPORT*  Clinical Data: Lung cancer.  CT CHEST  WITHOUT CONTRAST  Technique:  Multidetector CT imaging of the chest was performed following the standard protocol without IV contrast.  Comparison: 06/30/2011.  Findings: No pathologically enlarged mediastinal or axillary lymph nodes.  Hilar regions are difficult to definitively evaluate without IV contrast.  Atherosclerotic calcification of the arterial vasculature, including coronary arteries.  Heart size normal.  No pericardial effusion.  Radioactive seed placement and architectural distortion are seen in the posterior right hemithorax with scattered areas of scarring. Upper lobe predominant peribronchovascular nodularity appears unchanged.  No pleural fluid.  Airway is unremarkable.  Incidental imaging of the upper abdomen shows no acute findings. No worrisome lytic or sclerotic lesions.  Degenerative changes are seen in the spine with probable prominent Schmorl's nodes in the mid and lower thoracic spine.  IMPRESSION:  1.  Postoperative changes of radioactive seed placement in the right hemithorax with scattered scarring, stable. 2.  Upper lobe predominant peribronchovascular nodularity, unchanged and likely post infectious in etiology.   Original Report Authenticated By: Leanna Battles, M.D.     ASSESSMENT: This is a very pleasant 78 years old white male with history of stage IA non-small cell lung cancer status post right lower lobe superior segmentectomy with seed implants in December of 2008. The patient has been observation since that time with no evidence for disease recurrence.  PLAN: I discussed the scan results with the patient and his wife. I recommended for him to continue on observation for now with repeat CT scan of the chest without contrast in one year. He was advised to call immediately if he has any concerning symptoms in the interval.  All questions were answered. The patient knows to call the clinic with any problems, questions or concerns. We can certainly see the patient much sooner  if necessary.

## 2012-06-30 NOTE — Telephone Encounter (Signed)
gv and printed appt schedule for pt for Dec 2014...the patient aware that central scheduling will contact them with d/t of ct...the patient needed afternoon appt so schedule lab on Monday and est on wedenesday

## 2012-06-30 NOTE — Patient Instructions (Signed)
Your scan showed no evidence for disease recurrence. Followup in one year with repeat CT scan of the chest without contrast

## 2013-06-23 ENCOUNTER — Encounter: Payer: Self-pay | Admitting: Internal Medicine

## 2013-06-23 NOTE — Progress Notes (Signed)
Ct chest 06/24/13 auth # [id number]

## 2013-06-27 ENCOUNTER — Encounter (INDEPENDENT_AMBULATORY_CARE_PROVIDER_SITE_OTHER): Payer: Self-pay

## 2013-06-27 ENCOUNTER — Other Ambulatory Visit (HOSPITAL_BASED_OUTPATIENT_CLINIC_OR_DEPARTMENT_OTHER): Payer: Medicare Other

## 2013-06-27 ENCOUNTER — Ambulatory Visit (HOSPITAL_COMMUNITY)
Admission: RE | Admit: 2013-06-27 | Discharge: 2013-06-27 | Disposition: A | Discharge status: Home / Self Care | Payer: Medicare Other | Source: Ambulatory Visit | Attending: Internal Medicine | Admitting: Internal Medicine

## 2013-06-27 DIAGNOSIS — M519 Unspecified thoracic, thoracolumbar and lumbosacral intervertebral disc disorder: Secondary | ICD-10-CM | POA: Insufficient documentation

## 2013-06-27 DIAGNOSIS — I2510 Atherosclerotic heart disease of native coronary artery without angina pectoris: Secondary | ICD-10-CM | POA: Insufficient documentation

## 2013-06-27 DIAGNOSIS — I359 Nonrheumatic aortic valve disorder, unspecified: Secondary | ICD-10-CM | POA: Insufficient documentation

## 2013-06-27 DIAGNOSIS — J479 Bronchiectasis, uncomplicated: Secondary | ICD-10-CM | POA: Insufficient documentation

## 2013-06-27 DIAGNOSIS — C3430 Malignant neoplasm of lower lobe, unspecified bronchus or lung: Secondary | ICD-10-CM

## 2013-06-27 DIAGNOSIS — C3490 Malignant neoplasm of unspecified part of unspecified bronchus or lung: Secondary | ICD-10-CM

## 2013-06-27 DIAGNOSIS — R918 Other nonspecific abnormal finding of lung field: Secondary | ICD-10-CM | POA: Insufficient documentation

## 2013-06-27 DIAGNOSIS — Z923 Personal history of irradiation: Secondary | ICD-10-CM | POA: Insufficient documentation

## 2013-06-27 DIAGNOSIS — I700 Atherosclerosis of aorta: Secondary | ICD-10-CM | POA: Insufficient documentation

## 2013-06-27 LAB — CBC WITH DIFFERENTIAL/PLATELET
Basophils Absolute: 0 10*3/uL (ref 0.0–0.1)
Eosinophils Absolute: 0.3 10*3/uL (ref 0.0–0.5)
HCT: 35.6 % — ABNORMAL LOW (ref 38.4–49.9)
LYMPH%: 18.7 % (ref 14.0–49.0)
MCV: 98.7 fL — ABNORMAL HIGH (ref 79.3–98.0)
MONO#: 0.6 10*3/uL (ref 0.1–0.9)
MONO%: 8.1 % (ref 0.0–14.0)
NEUT#: 4.8 10*3/uL (ref 1.5–6.5)
NEUT%: 69.1 % (ref 39.0–75.0)
Platelets: 186 10*3/uL (ref 140–400)
RBC: 3.61 10*6/uL — ABNORMAL LOW (ref 4.20–5.82)
WBC: 7 10*3/uL (ref 4.0–10.3)

## 2013-06-27 LAB — COMPREHENSIVE METABOLIC PANEL (CC13)
Alkaline Phosphatase: 81 U/L (ref 40–150)
Anion Gap: 8 mEq/L (ref 3–11)
BUN: 31.7 mg/dL — ABNORMAL HIGH (ref 7.0–26.0)
CO2: 24 mEq/L (ref 22–29)
Creatinine: 1.9 mg/dL — ABNORMAL HIGH (ref 0.7–1.3)
Glucose: 195 mg/dl — ABNORMAL HIGH (ref 70–140)
Total Bilirubin: 0.42 mg/dL (ref 0.20–1.20)
Total Protein: 7.1 g/dL (ref 6.4–8.3)

## 2013-06-28 ENCOUNTER — Other Ambulatory Visit: Payer: Medicare Other | Admitting: Lab

## 2013-06-28 ENCOUNTER — Other Ambulatory Visit (HOSPITAL_COMMUNITY): Payer: Medicare Other

## 2013-06-29 ENCOUNTER — Encounter: Payer: Self-pay | Admitting: Internal Medicine

## 2013-06-29 ENCOUNTER — Telehealth: Payer: Self-pay | Admitting: Internal Medicine

## 2013-06-29 ENCOUNTER — Ambulatory Visit (HOSPITAL_BASED_OUTPATIENT_CLINIC_OR_DEPARTMENT_OTHER): Payer: Medicare Other | Admitting: Internal Medicine

## 2013-06-29 VITALS — BP 153/91 | HR 81 | Temp 97.8°F | Resp 18 | Ht 70.0 in | Wt 207.6 lb

## 2013-06-29 DIAGNOSIS — C3490 Malignant neoplasm of unspecified part of unspecified bronchus or lung: Secondary | ICD-10-CM

## 2013-06-29 DIAGNOSIS — Z85118 Personal history of other malignant neoplasm of bronchus and lung: Secondary | ICD-10-CM

## 2013-06-29 NOTE — Patient Instructions (Signed)
Followup visit in one year with repeat CT scan of the chest. 

## 2013-06-29 NOTE — Telephone Encounter (Signed)
appts made per 12/17 POF AVS and CAL given shh °

## 2013-06-29 NOTE — Progress Notes (Addendum)
[facility] Telephone:([phone]   Fax:([phone]) [phone]  OFFICE PROGRESS NOTE  SHAW,KIMBERLEE, MD [address] [place] [address]  DIAGNOSIS: Stage IA (T1N0M0) non-small cell lung cancer, adenocarcinoma, diagnosed in October 2008.   PRIOR THERAPY: Status post right lower lobe superior segmentectomy with seed implants under the care of Dr Edwyna Shell on June 22, 2007.   CURRENT THERAPY: Observation.  INTERVAL HISTORY: [name] 79 y.o. male returns to the clinic today for routine annual followup visit accompanied by his wife. He underwent several eye surgeries recently and he lost vision on the right eye. The patient is feeling fine today with no specific complaints except for shortness breath with exertion. He denied having any significant chest pain, cough or hemoptysis. He denied having any significant weight loss or night sweats. The patient had repeat CT scan of the chest performed recently and he is here for evaluation and discussion of his scan results.  MEDICAL HISTORY: Past Medical History  Diagnosis Date  . Diabetes mellitus   . Hypertension   . Hypercholesteremia   . Stroke   . PNA (pneumonia)   . Detached retina, right     ALLERGIES:  has No Known Allergies.  MEDICATIONS:  Current Outpatient Prescriptions  Medication Sig Dispense Refill  . Fenofibrate (TRICOR PO) Take 134 mg by mouth daily.        . fluorouracil (EFUDEX) 5 % cream       . insulin detemir (LEVEMIR) 100 UNIT/ML injection Inject 50 Units into the skin 2 (two) times daily. 50 in the morning, and 50 at night      . insulin lispro (HUMALOG) 100 UNIT/ML injection Inject 55 Units into the skin 3 (three) times daily before meals. 10 with breakfast, 20units with lunch, 25u with dinner      . lisinopril-hydrochlorothiazide (PRINZIDE,ZESTORETIC) 10-12.5 MG per tablet Take 1 tablet by mouth daily.        Marland Kitchen lovastatin (MEVACOR) 40 MG tablet Take 40 mg by mouth at  bedtime.        . ONE TOUCH ULTRA TEST test strip        No current facility-administered medications for this visit.    SURGICAL HISTORY:  Past Surgical History  Procedure Laterality Date  . Appendectomy      REVIEW OF SYSTEMS:  A comprehensive review of systems was negative except for: Respiratory: positive for dyspnea on exertion   PHYSICAL EXAMINATION: General appearance: alert, cooperative and no distress Head: Normocephalic, without obvious abnormality, atraumatic Neck: no adenopathy Lymph nodes: Cervical, supraclavicular, and axillary nodes normal. Resp: clear to auscultation bilaterally Cardio: regular rate and rhythm, S1, S2 normal, no murmur, click, rub or gallop GI: soft, non-tender; bowel sounds normal; no masses,  no organomegaly Extremities: extremities normal, atraumatic, no cyanosis or edema  ECOG PERFORMANCE STATUS: 1 - Symptomatic but completely ambulatory  Blood pressure 153/91, pulse 81, temperature 97.8 F (36.6 C), temperature source Oral, resp. rate 18, height 5\' 10"  (1.778 m), weight 207 lb 9.6 oz (94.167 kg).  LABORATORY DATA: Lab Results  Component Value Date   WBC 7.0 06/27/2013   HGB 12.0* 06/27/2013   HCT 35.6* 06/27/2013   MCV 98.7* 06/27/2013   PLT 186 06/27/2013      Chemistry      Component Value Date/Time   NA 134* 06/27/2013 1356   NA 133* 06/30/2011 1442   K 4.8 06/27/2013 1356   K 4.5 06/30/2011 1442  CL 105 06/28/2012 1358   CL 101 06/30/2011 1442   CO2 24 06/27/2013 1356   CO2 25 06/30/2011 1442   BUN 31.7* 06/27/2013 1356   BUN 33* 06/30/2011 1442   CREATININE 1.9* 06/27/2013 1356   CREATININE 2.25* 06/30/2011 1442      Component Value Date/Time   CALCIUM 8.9 06/27/2013 1356   CALCIUM 9.3 06/30/2011 1442   ALKPHOS 81 06/27/2013 1356   ALKPHOS 62 06/30/2011 1442   AST 19 06/27/2013 1356   AST 23 06/30/2011 1442   ALT 14 06/27/2013 1356   ALT 18 06/30/2011 1442   BILITOT 0.42 06/27/2013 1356   BILITOT 0.3  06/30/2011 1442       RADIOGRAPHIC STUDIES: Ct Chest Wo Contrast  06/27/2013   CLINICAL DATA:  History of lung cancer diagnosed in 2008 status post radiation therapy.  EXAM: CT CHEST WITHOUT CONTRAST  TECHNIQUE: Multidetector CT imaging of the chest was performed following the standard protocol without IV contrast.  COMPARISON:  Chest CT 06/28/2012.  FINDINGS: Mediastinum: Heart size is normal. There is no significant pericardial fluid, thickening or pericardial calcification. There is atherosclerosis of the thoracic aorta, the great vessels of the mediastinum and the coronary arteries, including calcified atherosclerotic plaque in the left main, left anterior descending and right coronary arteries. Probable stent placement in the left anterior descending coronary artery. Calcifications of the aortic valve. No pathologically enlarged mediastinal or hilar and lymph nodes. Nonenlarged calcified right peritracheal lymph nodes incidentally noted. Esophagus is unremarkable in appearance.  Lungs/Pleura: Postoperative changes of wedge resection in the right lower lobe are noted with a large number of surgical clips and/or radioactive seeds with regional architectural distortion, similar to prior studies. There is also some peripheral architectural distortion in the lateral segment of the right middle lobe as well, most compatible with chronic post surgical scarring. Mild diffuse bronchial wall thickening. 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 No acute consolidative airspace disease. No pleural effusions.  Upper Abdomen: Unremarkable.  Musculoskeletal: Numerous Schmorl's nodes in the spine. There are no aggressive appearing lytic or blastic lesions noted in the visualized portions of  the skeleton.  IMPRESSION: 1. Post procedural changes in the right lower lobe, as above. No definite findings to suggest local recurrence of disease or new metastatic disease in the thorax. 2. 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   Electronically Signed   By: Trudie Reed M.D.   On: 06/27/2013 16:36    ASSESSMENT AND PLAN: This is a very pleasant 79 years old white male with history of stage IA non-small cell lung cancer status post right lower lobe superior segmentectomy with seed implants in December of 2008. The patient has been observation since that time with no evidence for disease recurrence.  I discussed the scan results with the patient and his wife. I recommended for him to continue on observation for now with repeat CT scan of the chest without contrast in one year. He was advised to call immediately if he has any concerning symptoms in the interval.  All questions were answered. The patient knows to call the clinic with any problems, questions or concerns. We can certainly see the patient much sooner if necessary.

## 2013-06-30 ENCOUNTER — Ambulatory Visit: Payer: Medicare Other | Admitting: Internal Medicine

## 2013-10-05 ENCOUNTER — Other Ambulatory Visit: Payer: Self-pay | Admitting: Family Medicine

## 2013-10-25 ENCOUNTER — Encounter (HOSPITAL_COMMUNITY): Payer: Self-pay | Admitting: Emergency Medicine

## 2013-10-25 ENCOUNTER — Emergency Department (HOSPITAL_COMMUNITY): Payer: Medicare Other

## 2013-10-25 ENCOUNTER — Emergency Department (HOSPITAL_COMMUNITY)
Admission: EM | Admit: 2013-10-25 | Discharge: 2013-10-25 | Disposition: A | Discharge status: Home / Self Care | Payer: Medicare Other | Attending: Emergency Medicine | Admitting: Emergency Medicine

## 2013-10-25 DIAGNOSIS — E119 Type 2 diabetes mellitus without complications: Secondary | ICD-10-CM | POA: Insufficient documentation

## 2013-10-25 DIAGNOSIS — Z79899 Other long term (current) drug therapy: Secondary | ICD-10-CM | POA: Insufficient documentation

## 2013-10-25 DIAGNOSIS — Z794 Long term (current) use of insulin: Secondary | ICD-10-CM | POA: Insufficient documentation

## 2013-10-25 DIAGNOSIS — R1110 Vomiting, unspecified: Secondary | ICD-10-CM | POA: Insufficient documentation

## 2013-10-25 DIAGNOSIS — Z8673 Personal history of transient ischemic attack (TIA), and cerebral infarction without residual deficits: Secondary | ICD-10-CM | POA: Insufficient documentation

## 2013-10-25 DIAGNOSIS — I10 Essential (primary) hypertension: Secondary | ICD-10-CM | POA: Insufficient documentation

## 2013-10-25 DIAGNOSIS — Z8701 Personal history of pneumonia (recurrent): Secondary | ICD-10-CM | POA: Insufficient documentation

## 2013-10-25 DIAGNOSIS — F29 Unspecified psychosis not due to a substance or known physiological condition: Secondary | ICD-10-CM | POA: Insufficient documentation

## 2013-10-25 DIAGNOSIS — E11311 Type 2 diabetes mellitus with unspecified diabetic retinopathy with macular edema: Secondary | ICD-10-CM | POA: Insufficient documentation

## 2013-10-25 DIAGNOSIS — E7800 Pure hypercholesterolemia, unspecified: Secondary | ICD-10-CM | POA: Insufficient documentation

## 2013-10-25 DIAGNOSIS — Z87891 Personal history of nicotine dependence: Secondary | ICD-10-CM | POA: Insufficient documentation

## 2013-10-25 DIAGNOSIS — R4182 Altered mental status, unspecified: Secondary | ICD-10-CM | POA: Insufficient documentation

## 2013-10-25 DIAGNOSIS — H5440 Blindness, one eye, unspecified eye: Secondary | ICD-10-CM | POA: Insufficient documentation

## 2013-10-25 DIAGNOSIS — H3320 Serous retinal detachment, unspecified eye: Secondary | ICD-10-CM | POA: Insufficient documentation

## 2013-10-25 LAB — DIFFERENTIAL
Basophils Absolute: 0 10*3/uL (ref 0.0–0.1)
Basophils Relative: 0 % (ref 0–1)
EOS ABS: 0.2 10*3/uL (ref 0.0–0.7)
EOS PCT: 2 % (ref 0–5)
LYMPHS ABS: 1.9 10*3/uL (ref 0.7–4.0)
Lymphocytes Relative: 19 % (ref 12–46)
MONOS PCT: 9 % (ref 3–12)
Monocytes Absolute: 0.9 10*3/uL (ref 0.1–1.0)
NEUTROS PCT: 70 % (ref 43–77)
Neutro Abs: 6.9 10*3/uL (ref 1.7–7.7)

## 2013-10-25 LAB — COMPREHENSIVE METABOLIC PANEL
ALK PHOS: 90 U/L (ref 39–117)
ALT: 19 U/L (ref 0–53)
AST: 35 U/L (ref 0–37)
Albumin: 3.2 g/dL — ABNORMAL LOW (ref 3.5–5.2)
BUN: 34 mg/dL — ABNORMAL HIGH (ref 6–23)
CO2: 24 meq/L (ref 19–32)
Calcium: 8.5 mg/dL (ref 8.4–10.5)
Chloride: 100 mEq/L (ref 96–112)
Creatinine, Ser: 2.12 mg/dL — ABNORMAL HIGH (ref 0.50–1.35)
GFR, EST AFRICAN AMERICAN: 31 mL/min — AB (ref >90)
GFR, EST NON AFRICAN AMERICAN: 27 mL/min — AB (ref >90)
GLUCOSE: 193 mg/dL — AB (ref 70–99)
POTASSIUM: 4.5 meq/L (ref 3.7–5.3)
Sodium: 138 mEq/L (ref 137–147)
TOTAL PROTEIN: 7 g/dL (ref 6.0–8.3)
Total Bilirubin: 0.3 mg/dL (ref 0.3–1.2)

## 2013-10-25 LAB — PROTIME-INR
INR: 0.96 (ref 0.00–1.49)
Prothrombin Time: 12.6 seconds (ref 11.6–15.2)

## 2013-10-25 LAB — URINALYSIS, ROUTINE W REFLEX MICROSCOPIC
BILIRUBIN URINE: NEGATIVE
GLUCOSE, UA: 100 mg/dL — AB
KETONES UR: NEGATIVE mg/dL
Leukocytes, UA: NEGATIVE
Nitrite: NEGATIVE
Specific Gravity, Urine: 1.016 (ref 1.005–1.030)
UROBILINOGEN UA: 0.2 mg/dL (ref 0.0–1.0)
pH: 5.5 (ref 5.0–8.0)

## 2013-10-25 LAB — I-STAT CHEM 8, ED
BUN: 31 mg/dL — AB (ref 6–23)
CREATININE: 2.1 mg/dL — AB (ref 0.50–1.35)
Calcium, Ion: 1.13 mmol/L (ref 1.13–1.30)
Chloride: 101 mEq/L (ref 96–112)
Glucose, Bld: 193 mg/dL — ABNORMAL HIGH (ref 70–99)
HCT: 40 % (ref 39.0–52.0)
Hemoglobin: 13.6 g/dL (ref 13.0–17.0)
Potassium: 4 mEq/L (ref 3.7–5.3)
Sodium: 138 mEq/L (ref 137–147)
TCO2: 25 mmol/L (ref 0–100)

## 2013-10-25 LAB — CBC
HCT: 36.2 % — ABNORMAL LOW (ref 39.0–52.0)
HEMOGLOBIN: 12.5 g/dL — AB (ref 13.0–17.0)
MCH: 32.7 pg (ref 26.0–34.0)
MCHC: 34.5 g/dL (ref 30.0–36.0)
MCV: 94.8 fL (ref 78.0–100.0)
Platelets: 204 10*3/uL (ref 150–400)
RBC: 3.82 MIL/uL — ABNORMAL LOW (ref 4.22–5.81)
RDW: 12.6 % (ref 11.5–15.5)
WBC: 10 10*3/uL (ref 4.0–10.5)

## 2013-10-25 LAB — URINE MICROSCOPIC-ADD ON

## 2013-10-25 LAB — APTT: aPTT: 35 seconds (ref 24–37)

## 2013-10-25 LAB — I-STAT TROPONIN, ED: Troponin i, poc: 0.01 ng/mL (ref 0.00–0.08)

## 2013-10-25 NOTE — ED Notes (Signed)
The patient's wife said she is not sure when the patient's confusion started but she noticed it on Sunday during the day.  She also said he had been throwing up Saturday and Sunday with the patient throwing up.  He said he realized he was confused when [place] when in all actuality he was at home with his wife.  The patient's was confused into Monday and now the confusion has resolved but his thinking is "slow'.  The patient denies pain, LOC but he admits to throwing up all night Saturday, and Sunday.  The patient decided he needed to come in to be evaluated.

## 2013-10-25 NOTE — ED Provider Notes (Signed)
CSN: [id number]     Arrival date & time 10/25/13  1539 History   First MD Initiated Contact with Patient 10/25/13 1826     Chief Complaint  Patient presents with  . Altered Mental Status     (Consider location/radiation/quality/duration/timing/severity/associated sxs/prior Treatment) Patient is a 80 y.o. male presenting with altered mental status. The history is provided by the patient and medical records.  Altered Mental Status Associated symptoms: vomiting    This is an 80 year old male with past medical history significant for hypertension, diabetes, hypercholesterolemia, prior CVA without residual deficit, prior lung cancer status post lobectomy, macular degeneration and blindness right eye, presenting to the ED with wife for altered mental status.  Per wife, over the weekend patient had several episodes of spontaneous nonbloody, nonbilious emesis.  He had no reported abdominal pain or nausea.  He has continued having normal bowel movements and passing flatus.  Wife states yesterday his confusion seemed much worse, he was in the kitchen and thought he was at [facility]. He did not remember any details of his home, how to navigate around the [place], or the fact that he had lived there 50+ years.  Today, patient states he feels fine. He has had no episodes of vomiting and has tolerated PO without difficulty.  Wife states he has been acting as his usual self today.  He continues to deny any chest pain, shortness of breath, or abdominal pain. No recent cough, fevers, chills, or illness.  No changes in medications.  VS stable on arrival.  Past Medical History  Diagnosis Date  . Diabetes mellitus   . Hypertension   . Hypercholesteremia   . Stroke   . PNA (pneumonia)   . Detached retina, right    Past Surgical History  Procedure Laterality Date  . Appendectomy     Family History  Problem Relation Age of Onset  . Cancer Mother   . Cancer Brother    History  Substance Use  Topics  . Smoking status: Former Smoker -- 1.00 packs/day for 39 years    Quit date: 07/14/2004  . Smokeless tobacco: Not on file  . Alcohol Use: No    Review of Systems  Gastrointestinal: Positive for vomiting.  Neurological:       Cofusion  All other systems reviewed and are negative.     Allergies  Review of patient's allergies indicates no known allergies.  Home Medications   Prior to Admission medications   Medication Sig Start Date End Date Taking? Authorizing Provider  Fenofibrate (TRICOR PO) Take 134 mg by mouth daily.      Historical Provider, MD  fluorouracil (EFUDEX) 5 % cream  06/04/12   Historical Provider, MD  insulin detemir (LEVEMIR) 100 UNIT/ML injection Inject 50 Units into the skin 2 (two) times daily. 50 in the morning, and 50 at night    Historical Provider, MD  insulin lispro (HUMALOG) 100 UNIT/ML injection Inject 55 Units into the skin 3 (three) times daily before meals. 10 with breakfast, 20units with lunch, 25u with dinner    Historical Provider, MD  lisinopril-hydrochlorothiazide (PRINZIDE,ZESTORETIC) 10-12.5 MG per tablet Take 1 tablet by mouth daily.      Historical Provider, MD  lovastatin (MEVACOR) 40 MG tablet Take 40 mg by mouth at bedtime.      Historical Provider, MD  ONE TOUCH ULTRA TEST test strip  04/19/12   Historical Provider, MD   BP 175/78  Pulse 91  Temp(Src) 98.4 F (36.9 C) (Oral)  Resp  18  Ht 5\' 9"  (1.753 m)  Wt 205 lb (92.987 kg)  BMI 30.26 kg/m2  SpO2 100%  Physical Exam  Nursing note and vitals reviewed. Constitutional: He is oriented to person, place, and time. He appears well-developed and well-nourished.  HENT:  Head: Normocephalic and atraumatic.  Mouth/Throat: Oropharynx is clear and moist.  Eyes: Conjunctivae and EOM are normal. Pupils are equal, round, and reactive to light.  Neck: Normal range of motion.  Cardiovascular: Normal rate, regular rhythm and normal heart sounds.   Pulmonary/Chest: Effort normal and  breath sounds normal.  Abdominal: Soft. Bowel sounds are normal. He exhibits no distension. There is no tenderness. There is no rigidity, no guarding and no CVA tenderness.  Abdomen soft, non-distended, no peritoneal signs  Musculoskeletal: Normal range of motion.  Neurological: He is alert and oriented to person, place, and time. He has normal strength. He displays no tremor. No cranial nerve deficit or sensory deficit. He displays no seizure activity. Gait normal.  AAOx3, answering questions and following commands appropriately; equal strength UE and LE bilaterally; CN grossly intact; moves all extremities appropriately without ataxia; normal finger to nose bilaterally; no pronator drift; no focal neuro deficits or facial asymmetry appreciated; gait non-ataxic  Skin: Skin is warm and dry.  Psychiatric: He has a normal mood and affect.    ED Course  Procedures (including critical care time) Labs Review Labs Reviewed  CBC - Abnormal; Notable for the following:    RBC 3.82 (*)    Hemoglobin 12.5 (*)    HCT 36.2 (*)    All other components within normal limits  COMPREHENSIVE METABOLIC PANEL - Abnormal; Notable for the following:    Glucose, Bld 193 (*)    BUN 34 (*)    Creatinine, Ser 2.12 (*)    Albumin 3.2 (*)    GFR calc non Af Amer 27 (*)    GFR calc Af Amer 31 (*)    All other components within normal limits  I-STAT CHEM 8, ED - Abnormal; Notable for the following:    BUN 31 (*)    Creatinine, Ser 2.10 (*)    Glucose, Bld 193 (*)    All other components within normal limits  PROTIME-INR  APTT  DIFFERENTIAL  I-STAT TROPOININ, ED  CBG MONITORING, ED    Imaging Review Ct Head (brain) Wo Contrast  10/25/2013   CLINICAL DATA:  Altered mental status  EXAM: CT HEAD WITHOUT CONTRAST  TECHNIQUE: Contiguous axial images were obtained from the base of the skull through the vertex without intravenous contrast.  COMPARISON:  None.  FINDINGS: No evidence of parenchymal hemorrhage or  extra-axial fluid collection. No mass lesion, mass effect, or midline shift.  No CT evidence of acute infarction.  Subcortical white matter and periventricular small vessel ischemic changes. Intracranial atherosclerosis.  Global cortical atrophy.  Secondary ventricular prominence.  The visualized paranasal sinuses are essentially clear. The mastoid air cells are unopacified.  Hyperdense globes bilaterally, likely postprocedural.  No evidence of calvarial fracture.  IMPRESSION: No evidence of acute intracranial abnormality.  Atrophy with small vessel ischemic changes and intracranial atherosclerosis.   Electronically Signed   By: Julian Hy M.D.   On: 10/25/2013 17:24     EKG Interpretation   Date/Time:  Tuesday October 25 2013 15:46:54 EDT Ventricular Rate:  98 PR Interval:  184 QRS Duration: 88 QT Interval:  326 QTC Calculation: 416 R Axis:   47 Text Interpretation:  Normal sinus rhythm with sinus arrhythmia  Nonspecific ST and T wave abnormality Abnormal ECG      MDM   Final diagnoses:  Altered mental status   EKG NSR without acute ischemic change. Labs appear baseline for pt when compared with previous.  CT head without acute findings.  UA without infection.  Acute abd series negative for acute pathology.  While in the emergency department today, patient has remained at his baseline orientation, AAOx4.  His abdominal exam is benign. He has eaten a full meal without further vomiting. Wife states he appears at his baseline.  Pt is without any current complaints and given his negative workup and return to baseline orientation, he will be discharged home with close PCP followup.  He will continue all home meds as instructed.  Signs or symptoms that would warrant ED return including recurrent confusion, changes in mental status, aphasia, ataxia, unilateral weakness, dizziness, or recurrent falls were discussed with pt and wife-- they acknowledged understanding and agreed with plan of  care.  Discussed with Dr. Alvino Chapel who personally evaluated pt and agrees with plan of care.  Larene Pickett, PA-C 10/25/13 [phone]

## 2013-10-25 NOTE — Discharge Instructions (Signed)
Continue all home medications. Follow up with your primary care physician. Return to the ED for new or worsening symptoms-- recurrent confusion, changes in speech, unilateral weakness, gait disturbance, inability to walk, facial droop, etc.

## 2013-10-26 NOTE — ED Provider Notes (Signed)
Medical screening examination/treatment/procedure(s) were performed by non-physician practitioner and as supervising physician I was immediately available for consultation/collaboration.   EKG Interpretation   Date/Time:  Tuesday October 25 2013 15:46:54 EDT Ventricular Rate:  98 PR Interval:  184 QRS Duration: 88 QT Interval:  326 QTC Calculation: 416 R Axis:   47 Text Interpretation:  Normal sinus rhythm with sinus arrhythmia  Nonspecific ST and T wave abnormality Abnormal ECG       Gar Glance R. Alvino Chapel, MD 10/26/13 0100

## 2014-06-26 ENCOUNTER — Other Ambulatory Visit (HOSPITAL_BASED_OUTPATIENT_CLINIC_OR_DEPARTMENT_OTHER): Payer: Medicare Other

## 2014-06-26 ENCOUNTER — Encounter (HOSPITAL_COMMUNITY): Payer: Self-pay

## 2014-06-26 ENCOUNTER — Ambulatory Visit (HOSPITAL_COMMUNITY)
Admission: RE | Admit: 2014-06-26 | Discharge: 2014-06-26 | Disposition: A | Discharge status: Home / Self Care | Payer: Medicare Other | Source: Ambulatory Visit | Attending: Internal Medicine | Admitting: Internal Medicine

## 2014-06-26 DIAGNOSIS — C3490 Malignant neoplasm of unspecified part of unspecified bronchus or lung: Secondary | ICD-10-CM

## 2014-06-26 HISTORY — DX: Malignant (primary) neoplasm, unspecified: C80.1

## 2014-06-26 LAB — COMPREHENSIVE METABOLIC PANEL (CC13)
ALBUMIN: 3.3 g/dL — AB (ref 3.5–5.0)
ALK PHOS: 86 U/L (ref 40–150)
ALT: 19 U/L (ref 0–55)
AST: 23 U/L (ref 5–34)
Anion Gap: 8 mEq/L (ref 3–11)
BUN: 36.1 mg/dL — ABNORMAL HIGH (ref 7.0–26.0)
CO2: 24 mEq/L (ref 22–29)
Calcium: 8.7 mg/dL (ref 8.4–10.4)
Chloride: 102 mEq/L (ref 98–109)
Creatinine: 2.3 mg/dL — ABNORMAL HIGH (ref 0.7–1.3)
EGFR: 25 mL/min/{1.73_m2} — ABNORMAL LOW (ref >90)
Glucose: 243 mg/dl — ABNORMAL HIGH (ref 70–140)
POTASSIUM: 4.8 meq/L (ref 3.5–5.1)
SODIUM: 134 meq/L — AB (ref 136–145)
Total Bilirubin: 0.27 mg/dL (ref 0.20–1.20)
Total Protein: 6.9 g/dL (ref 6.4–8.3)

## 2014-06-26 LAB — CBC WITH DIFFERENTIAL/PLATELET
BASO%: 0.5 % (ref 0.0–2.0)
BASOS ABS: 0 10*3/uL (ref 0.0–0.1)
EOS%: 3.5 % (ref 0.0–7.0)
Eosinophils Absolute: 0.3 10*3/uL (ref 0.0–0.5)
HCT: 34.8 % — ABNORMAL LOW (ref 38.4–49.9)
HGB: 11.3 g/dL — ABNORMAL LOW (ref 13.0–17.1)
LYMPH%: 16.2 % (ref 14.0–49.0)
MCH: 32.1 pg (ref 27.2–33.4)
MCHC: 32.4 g/dL (ref 32.0–36.0)
MCV: 99 fL — AB (ref 79.3–98.0)
MONO#: 0.6 10*3/uL (ref 0.1–0.9)
MONO%: 8 % (ref 0.0–14.0)
NEUT#: 5.8 10*3/uL (ref 1.5–6.5)
NEUT%: 71.8 % (ref 39.0–75.0)
Platelets: 204 10*3/uL (ref 140–400)
RBC: 3.52 10*6/uL — AB (ref 4.20–5.82)
RDW: 12.8 % (ref 11.0–14.6)
WBC: 8.1 10*3/uL (ref 4.0–10.3)
lymph#: 1.3 10*3/uL (ref 0.9–3.3)

## 2014-06-28 ENCOUNTER — Ambulatory Visit (HOSPITAL_BASED_OUTPATIENT_CLINIC_OR_DEPARTMENT_OTHER): Payer: Medicare Other | Admitting: Internal Medicine

## 2014-06-28 ENCOUNTER — Encounter: Payer: Self-pay | Admitting: Internal Medicine

## 2014-06-28 ENCOUNTER — Telehealth: Payer: Self-pay | Admitting: Internal Medicine

## 2014-06-28 VITALS — BP 166/67 | HR 83 | Temp 97.7°F | Resp 19 | Ht 69.0 in | Wt 204.6 lb

## 2014-06-28 DIAGNOSIS — Z85118 Personal history of other malignant neoplasm of bronchus and lung: Secondary | ICD-10-CM

## 2014-06-28 DIAGNOSIS — C3431 Malignant neoplasm of lower lobe, right bronchus or lung: Secondary | ICD-10-CM

## 2014-06-28 NOTE — Telephone Encounter (Signed)
Pt confirmed labs/ov per 12/15 POF, gave pt AVS.... KJ

## 2014-06-28 NOTE — Progress Notes (Signed)
[place] [phone]   Fax:(336) [address] [place] [address]  DIAGNOSIS: Stage IA (T1N0M0) non-small cell lung cancer, adenocarcinoma, diagnosed in October 2008.   PRIOR THERAPY: Status post right lower lobe superior segmentectomy with seed implants under the care of Dr Arlyce Dice on June 22, 2007.   CURRENT THERAPY: Observation.  INTERVAL HISTORY: [name] 80 y.o. male returns to the clinic today for routine annual followup visit accompanied by his wife. The patient is feeling fine today with no specific complaints except for shortness breath with exertion. He denied having any significant chest pain, cough or hemoptysis. He denied having any significant weight loss or night sweats. The patient had repeat CT scan of the chest performed recently and he is here for evaluation and discussion of his scan results.  MEDICAL HISTORY: Past Medical History  Diagnosis Date  . Diabetes mellitus   . Hypertension   . Hypercholesteremia   . Stroke   . PNA (pneumonia)   . Detached retina, right   . Cancer     lung ca dx'd 2008    ALLERGIES:  has No Known Allergies.  MEDICATIONS:  Current Outpatient Prescriptions  Medication Sig Dispense Refill  . aspirin 81 MG tablet Take 81 mg by mouth daily.    . insulin detemir (LEVEMIR) 100 UNIT/ML injection Inject 50 Units into the skin at bedtime.     . insulin lispro (HUMALOG) 100 UNIT/ML injection Inject 20 Units into the skin every evening. 20 units in evening only    . lisinopril-hydrochlorothiazide (PRINZIDE,ZESTORETIC) 10-12.5 MG per tablet Take 1 tablet by mouth daily.      . Multiple Vitamins-Minerals (ICAPS MV PO) Take 1 capsule by mouth daily.     No current facility-administered medications for this visit.    SURGICAL HISTORY:  Past Surgical History  Procedure Laterality Date  . Appendectomy      REVIEW OF SYSTEMS:   A comprehensive review of systems was negative except for: Respiratory: positive for dyspnea on exertion   PHYSICAL EXAMINATION: General appearance: alert, cooperative and no distress Head: Normocephalic, without obvious abnormality, atraumatic Neck: no adenopathy Lymph nodes: Cervical, supraclavicular, and axillary nodes normal. Resp: clear to auscultation bilaterally Cardio: regular rate and rhythm, S1, S2 normal, no murmur, click, rub or gallop GI: soft, non-tender; bowel sounds normal; no masses,  no organomegaly Extremities: extremities normal, atraumatic, no cyanosis or edema  ECOG PERFORMANCE STATUS: 1 - Symptomatic but completely ambulatory  Blood pressure 166/67, pulse 83, temperature 97.7 F (36.5 C), temperature source Oral, resp. rate 19, height 5\' 9"  (1.753 m), weight 204 lb 9.6 oz (92.806 kg), SpO2 100 %.  LABORATORY DATA: Lab Results  Component Value Date   WBC 8.1 06/26/2014   HGB 11.3* 06/26/2014   HCT 34.8* 06/26/2014   MCV 99.0* 06/26/2014   PLT 204 06/26/2014      Chemistry      Component Value Date/Time   NA 134* 06/26/2014 1347   NA 138 10/25/2013 1619   K 4.8 06/26/2014 1347   K 4.0 10/25/2013 1619   CL 101 10/25/2013 1619   CL 105 06/28/2012 1358   CO2 24 06/26/2014 1347   CO2 24 10/25/2013 1557   BUN 36.1* 06/26/2014 1347   BUN 31* 10/25/2013 1619   CREATININE 2.3* 06/26/2014 1347   CREATININE 2.10* 10/25/2013 1619      Component Value Date/Time  CALCIUM 8.7 06/26/2014 1347   CALCIUM 8.5 10/25/2013 1557   ALKPHOS 86 06/26/2014 1347   ALKPHOS 90 10/25/2013 1557   AST 23 06/26/2014 1347   AST 35 10/25/2013 1557   ALT 19 06/26/2014 1347   ALT 19 10/25/2013 1557   BILITOT 0.27 06/26/2014 1347   BILITOT 0.3 10/25/2013 1557       RADIOGRAPHIC STUDIES: Ct Chest Wo Contrast  06/26/2014   CLINICAL DATA:  Lung cancer.  Partial lobectomy on the right.  EXAM: CT CHEST WITHOUT CONTRAST  TECHNIQUE: Multidetector CT imaging of the chest was  performed following the standard protocol without IV contrast.  COMPARISON:  CT 06/27/2013  FINDINGS: No axillary or supraclavicular lymphadenopathy. No mediastinal hilar lymphadenopathy. No pericardial fluid. Esophagus is normal.  There is postsurgical change in the right upper lobe with volume loss. There are brachytherapy seeds in the right upper lobe. There is a fine a branching pattern in the right upper lobe not changed from prior. There is some peripheral reticulation and linear thickening in the right lower lobe also unchanged consists with radiation change. There is fine airspace disease in the medial right middle lobe which has an inflammatory pattern. There is no suspicious nodularity. Within the left upper lobe there smudgy nodules which are also unchanged from prior.  Limited view of the upper abdomen is unremarkable. Multiple gallstones noted. Limited view of the skeleton demonstrates degenerate spurring of the spine.  IMPRESSION: 1. Posttherapy change in the right lower lobe without evidence of local recurrence. 2. No evidence of metastasis.   Electronically Signed   By: Suzy Bouchard M.D.   On: 06/26/2014 15:22    ASSESSMENT AND PLAN: This is a very pleasant 81 years old white male with history of stage IA non-small cell lung cancer status post right lower lobe superior segmentectomy with seed implants in December of 2008. The patient the patient is doing fine today and has been observation since that time with no evidence for disease recurrence.  I discussed the scan results with the patient and his wife. I recommended for him to continue on observation for now with repeat CT scan of the chest without contrast in one year. He was advised to call immediately if he has any concerning symptoms in the interval.  All questions were answered. The patient knows to call the clinic with any problems, questions or concerns. We can certainly see the patient much sooner if necessary.  Disclaimer:  This note was dictated with voice recognition software. Similar sounding words can inadvertently be transcribed and may be missed upon review.

## 2014-06-29 ENCOUNTER — Other Ambulatory Visit: Payer: Medicare Other

## 2014-06-29 ENCOUNTER — Ambulatory Visit: Payer: Medicare Other | Admitting: Internal Medicine

## 2015-06-25 ENCOUNTER — Ambulatory Visit (HOSPITAL_COMMUNITY): Payer: Medicare Other

## 2015-06-25 ENCOUNTER — Ambulatory Visit (HOSPITAL_COMMUNITY)
Admission: RE | Admit: 2015-06-25 | Discharge: 2015-06-25 | Disposition: A | Discharge status: Home / Self Care | Payer: Medicare Other | Source: Ambulatory Visit | Attending: Internal Medicine | Admitting: Internal Medicine

## 2015-06-25 ENCOUNTER — Other Ambulatory Visit (HOSPITAL_BASED_OUTPATIENT_CLINIC_OR_DEPARTMENT_OTHER): Payer: Medicare Other

## 2015-06-25 DIAGNOSIS — C3431 Malignant neoplasm of lower lobe, right bronchus or lung: Secondary | ICD-10-CM | POA: Insufficient documentation

## 2015-06-25 LAB — COMPREHENSIVE METABOLIC PANEL WITH GFR
ALT: 11 U/L (ref 0–55)
AST: 18 U/L (ref 5–34)
Albumin: 2.4 g/dL — ABNORMAL LOW (ref 3.5–5.0)
Alkaline Phosphatase: 83 U/L (ref 40–150)
Anion Gap: 8 meq/L (ref 3–11)
BUN: 34.4 mg/dL — ABNORMAL HIGH (ref 7.0–26.0)
CO2: 22 meq/L (ref 22–29)
Calcium: 7.9 mg/dL — ABNORMAL LOW (ref 8.4–10.4)
Chloride: 108 meq/L (ref 98–109)
Creatinine: 2.9 mg/dL — ABNORMAL HIGH (ref 0.7–1.3)
EGFR: 19 ml/min/1.73 m2 — ABNORMAL LOW (ref >90)
Glucose: 202 mg/dL — ABNORMAL HIGH (ref 70–140)
Potassium: 3.9 meq/L (ref 3.5–5.1)
Sodium: 138 meq/L (ref 136–145)
Total Bilirubin: <0.3 mg/dL (ref 0.20–1.20)
Total Protein: 6.2 g/dL — ABNORMAL LOW (ref 6.4–8.3)

## 2015-06-25 LAB — CBC WITH DIFFERENTIAL/PLATELET
BASO%: 0.1 % (ref 0.0–2.0)
Basophils Absolute: 0 10e3/uL (ref 0.0–0.1)
EOS%: 3.9 % (ref 0.0–7.0)
Eosinophils Absolute: 0.3 10e3/uL (ref 0.0–0.5)
HCT: 31.9 % — ABNORMAL LOW (ref 38.4–49.9)
HGB: 10.9 g/dL — ABNORMAL LOW (ref 13.0–17.1)
LYMPH%: 16.3 % (ref 14.0–49.0)
MCH: 32.8 pg (ref 27.2–33.4)
MCHC: 34.2 g/dL (ref 32.0–36.0)
MCV: 96.1 fL (ref 79.3–98.0)
MONO#: 0.7 10e3/uL (ref 0.1–0.9)
MONO%: 8.7 % (ref 0.0–14.0)
NEUT#: 6 10e3/uL (ref 1.5–6.5)
NEUT%: 71 % (ref 39.0–75.0)
Platelets: 167 10e3/uL (ref 140–400)
RBC: 3.32 10e6/uL — ABNORMAL LOW (ref 4.20–5.82)
RDW: 13 % (ref 11.0–14.6)
WBC: 8.4 10e3/uL (ref 4.0–10.3)
lymph#: 1.4 10e3/uL (ref 0.9–3.3)

## 2015-07-02 ENCOUNTER — Ambulatory Visit (HOSPITAL_BASED_OUTPATIENT_CLINIC_OR_DEPARTMENT_OTHER): Payer: Medicare Other | Admitting: Internal Medicine

## 2015-07-02 ENCOUNTER — Encounter: Payer: Self-pay | Admitting: Internal Medicine

## 2015-07-02 VITALS — BP 177/70 | HR 88 | Temp 98.2°F | Resp 20 | Ht 69.0 in | Wt 195.0 lb

## 2015-07-02 DIAGNOSIS — C3431 Malignant neoplasm of lower lobe, right bronchus or lung: Secondary | ICD-10-CM

## 2015-07-02 DIAGNOSIS — Z85118 Personal history of other malignant neoplasm of bronchus and lung: Secondary | ICD-10-CM | POA: Diagnosis not present

## 2015-07-02 DIAGNOSIS — N189 Chronic kidney disease, unspecified: Secondary | ICD-10-CM

## 2015-07-02 DIAGNOSIS — I10 Essential (primary) hypertension: Secondary | ICD-10-CM

## 2015-07-02 NOTE — Progress Notes (Signed)
[place] Telephone:(336) [phone]   Fax:(336) [address] [place] [address]  DIAGNOSIS: Stage IA (T1N0M0) non-small cell lung cancer, adenocarcinoma, diagnosed in October 2008.   PRIOR THERAPY: Status post right lower lobe superior segmentectomy with seed implants under the care of Dr Arlyce Dice on June 22, 2007.   CURRENT THERAPY: Observation.  INTERVAL HISTORY: [name] 81 y.o. male returns to the clinic today for routine annual followup visit accompanied by his wife. The patient has been observation for the last 9 years. He has some right eye hemorrhage recently. His blood pressure is not well controlled and he is not taking any blood pressure medications. The patient is feeling fine today with no specific complaints except for shortness breath with exertion. He denied having any significant chest pain, cough or hemoptysis. He denied having any significant weight loss or night sweats. The patient had repeat CT scan of the chest performed recently and he is here for evaluation and discussion of his scan results.  MEDICAL HISTORY: Past Medical History  Diagnosis Date  . Diabetes mellitus   . Hypertension   . Hypercholesteremia   . Stroke   . PNA (pneumonia)   . Detached retina, right   . Cancer     lung ca dx'd 2008    ALLERGIES:  has No Known Allergies.  MEDICATIONS:  Current Outpatient Prescriptions  Medication Sig Dispense Refill  . aspirin 81 MG tablet Take 81 mg by mouth daily.    . insulin lispro (HUMALOG) 100 UNIT/ML injection Inject 20 Units into the skin every evening. 20 units in evening only    . Multiple Vitamins-Minerals (ICAPS MV PO) Take 1 capsule by mouth daily.    . insulin detemir (LEVEMIR) 100 UNIT/ML injection Inject 50 Units into the skin at bedtime. Reported on 07/02/2015    . lisinopril-hydrochlorothiazide (PRINZIDE,ZESTORETIC) 10-12.5 MG per tablet Take  1 tablet by mouth daily. Reported on 07/02/2015     No current facility-administered medications for this visit.    SURGICAL HISTORY:  Past Surgical History  Procedure Laterality Date  . Appendectomy      REVIEW OF SYSTEMS:  A comprehensive review of systems was negative except for: Respiratory: positive for dyspnea on exertion   PHYSICAL EXAMINATION: General appearance: alert, cooperative and no distress Head: Normocephalic, without obvious abnormality, atraumatic Neck: no adenopathy Lymph nodes: Cervical, supraclavicular, and axillary nodes normal. Resp: clear to auscultation bilaterally Cardio: regular rate and rhythm, S1, S2 normal, no murmur, click, rub or gallop GI: soft, non-tender; bowel sounds normal; no masses,  no organomegaly Extremities: extremities normal, atraumatic, no cyanosis or edema  ECOG PERFORMANCE STATUS: 1 - Symptomatic but completely ambulatory  Blood pressure 177/70, pulse 88, temperature 98.2 F (36.8 C), temperature source Oral, resp. rate 20, height '5\' 9"'$  (1.753 m), weight 195 lb (88.451 kg), SpO2 100 %.  LABORATORY DATA: Lab Results  Component Value Date   WBC 8.4 06/25/2015   HGB 10.9* 06/25/2015   HCT 31.9* 06/25/2015   MCV 96.1 06/25/2015   PLT 167 06/25/2015      Chemistry      Component Value Date/Time   NA 138 06/25/2015 1347   NA 138 10/25/2013 1619   K 3.9 06/25/2015 1347   K 4.0 10/25/2013 1619   CL 101 10/25/2013 1619   CL 105 06/28/2012 1358   CO2 22 06/25/2015 1347   CO2 24 10/25/2013 1557  BUN 34.4* 06/25/2015 1347   BUN 31* 10/25/2013 1619   CREATININE 2.9* 06/25/2015 1347   CREATININE 2.10* 10/25/2013 1619      Component Value Date/Time   CALCIUM 7.9* 06/25/2015 1347   CALCIUM 8.5 10/25/2013 1557   ALKPHOS 83 06/25/2015 1347   ALKPHOS 90 10/25/2013 1557   AST 18 06/25/2015 1347   AST 35 10/25/2013 1557   ALT 11 06/25/2015 1347   ALT 19 10/25/2013 1557   BILITOT <0.30 06/25/2015 1347   BILITOT 0.3 10/25/2013  1557       RADIOGRAPHIC STUDIES: Ct Chest Wo Contrast  06/25/2015  CLINICAL DATA:  Restaging lung cancer diagnosed 2008. RIGHT lobectomy. EXAM: CT CHEST WITHOUT CONTRAST TECHNIQUE: Multidetector CT imaging of the chest was performed following the standard protocol without IV contrast. COMPARISON:  06/26/2014 FINDINGS: Mediastinum/Nodes: No axillary or supraclavicular lymphadenopathy. No mediastinal hilar lymphadenopathy. No pericardial fluid. Lungs/Pleura: The postsurgical change in the RIGHT upper lobe with brachytherapy seeds. There is a mild reticular pattern without discrete nodularity. Linear thickening at the RIGHT lung base is unchanged. The LEFT lung is clear. This is several scattered peripheral nodules the LEFT upper lobe (image 20, series 5) which are stable. Upper abdomen: Limited view of the liver, kidneys, pancreas are unremarkable. Normal adrenal glands. Musculoskeletal: Multiple lucent lesions with in the mid thoracic spine vertebral bodies suggestive of advanced osteoporosis. Bone lucencies may relate to radiation change additionally. Bulky osteophytosis new IMPRESSION: 1. Post surgical and brachytherapy change in the RIGHT upper lobe without evidence of local recurrence. 2. No evidence of metastatic disease. 3. Stable peripheral nodules in the LEFT upper lobe. Electronically Signed   By: Suzy Bouchard M.D.   On: 06/25/2015 15:11    ASSESSMENT AND PLAN: This is a very pleasant 81 years old white male with history of stage IA non-small cell lung cancer status post right lower lobe superior segmentectomy with seed implants in December of 2008. The patient the patient is doing fine today and has been observation since that time with no evidence for disease recurrence.  I discussed the scan results with the patient and his wife. I recommended for him to continue on observation for now with her routine follow-up visit with his primary care physician at this point.  For the hypertension I  strongly advised the patient to check his blood pressure at home several times and to report to his primary care physician the results to consider starting antihypertensive medication for him. For the insufficiency the patient is followed by Dr. Florene Glen. He was advised to call immediately if he has any concerning symptoms in the interval.  All questions were answered. The patient knows to call the clinic with any problems, questions or concerns. We can certainly see the patient much sooner if necessary.  Disclaimer: This note was dictated with voice recognition software. Similar sounding words can inadvertently be transcribed and may be missed upon review.

## 2015-10-16 ENCOUNTER — Other Ambulatory Visit: Payer: Self-pay | Admitting: *Deleted

## 2015-10-16 DIAGNOSIS — Z01810 Encounter for preprocedural cardiovascular examination: Secondary | ICD-10-CM

## 2015-10-16 DIAGNOSIS — N185 Chronic kidney disease, stage 5: Secondary | ICD-10-CM

## 2015-11-06 ENCOUNTER — Encounter: Payer: Self-pay | Admitting: Vascular Surgery

## 2015-11-07 ENCOUNTER — Encounter (HOSPITAL_COMMUNITY): Payer: Self-pay | Admitting: Emergency Medicine

## 2015-11-07 ENCOUNTER — Emergency Department (HOSPITAL_COMMUNITY): Payer: Medicare Other

## 2015-11-07 ENCOUNTER — Emergency Department (HOSPITAL_COMMUNITY)
Admission: EM | Admit: 2015-11-07 | Discharge: 2015-11-07 | Disposition: A | Discharge status: Home / Self Care | Payer: Medicare Other | Attending: Emergency Medicine | Admitting: Emergency Medicine

## 2015-11-07 DIAGNOSIS — Z7982 Long term (current) use of aspirin: Secondary | ICD-10-CM | POA: Diagnosis not present

## 2015-11-07 DIAGNOSIS — Z87891 Personal history of nicotine dependence: Secondary | ICD-10-CM | POA: Diagnosis not present

## 2015-11-07 DIAGNOSIS — Z8659 Personal history of other mental and behavioral disorders: Secondary | ICD-10-CM | POA: Insufficient documentation

## 2015-11-07 DIAGNOSIS — Z8701 Personal history of pneumonia (recurrent): Secondary | ICD-10-CM | POA: Diagnosis not present

## 2015-11-07 DIAGNOSIS — S40012A Contusion of left shoulder, initial encounter: Secondary | ICD-10-CM | POA: Diagnosis not present

## 2015-11-07 DIAGNOSIS — Z79899 Other long term (current) drug therapy: Secondary | ICD-10-CM | POA: Insufficient documentation

## 2015-11-07 DIAGNOSIS — W01198A Fall on same level from slipping, tripping and stumbling with subsequent striking against other object, initial encounter: Secondary | ICD-10-CM | POA: Insufficient documentation

## 2015-11-07 DIAGNOSIS — E119 Type 2 diabetes mellitus without complications: Secondary | ICD-10-CM | POA: Insufficient documentation

## 2015-11-07 DIAGNOSIS — I10 Essential (primary) hypertension: Secondary | ICD-10-CM | POA: Insufficient documentation

## 2015-11-07 DIAGNOSIS — Z85118 Personal history of other malignant neoplasm of bronchus and lung: Secondary | ICD-10-CM | POA: Insufficient documentation

## 2015-11-07 DIAGNOSIS — Z794 Long term (current) use of insulin: Secondary | ICD-10-CM | POA: Diagnosis not present

## 2015-11-07 DIAGNOSIS — Z8673 Personal history of transient ischemic attack (TIA), and cerebral infarction without residual deficits: Secondary | ICD-10-CM | POA: Insufficient documentation

## 2015-11-07 DIAGNOSIS — Y93E1 Activity, personal bathing and showering: Secondary | ICD-10-CM | POA: Diagnosis not present

## 2015-11-07 DIAGNOSIS — Y998 Other external cause status: Secondary | ICD-10-CM | POA: Insufficient documentation

## 2015-11-07 DIAGNOSIS — Y92002 Bathroom of unspecified non-institutional (private) residence single-family (private) house as the place of occurrence of the external cause: Secondary | ICD-10-CM | POA: Insufficient documentation

## 2015-11-07 DIAGNOSIS — S4992XA Unspecified injury of left shoulder and upper arm, initial encounter: Secondary | ICD-10-CM | POA: Diagnosis present

## 2015-11-07 LAB — I-STAT TROPONIN, ED: TROPONIN I, POC: 0.03 ng/mL (ref 0.00–0.08)

## 2015-11-07 LAB — CBC WITH DIFFERENTIAL/PLATELET
BASOS ABS: 0 10*3/uL (ref 0.0–0.1)
BASOS PCT: 0 %
EOS ABS: 0.1 10*3/uL (ref 0.0–0.7)
Eosinophils Relative: 1 %
HCT: 32.6 % — ABNORMAL LOW (ref 39.0–52.0)
Hemoglobin: 11 g/dL — ABNORMAL LOW (ref 13.0–17.0)
Lymphocytes Relative: 6 %
Lymphs Abs: 0.9 10*3/uL (ref 0.7–4.0)
MCH: 32.4 pg (ref 26.0–34.0)
MCHC: 33.7 g/dL (ref 30.0–36.0)
MCV: 95.9 fL (ref 78.0–100.0)
MONO ABS: 0.8 10*3/uL (ref 0.1–1.0)
MONOS PCT: 5 %
Neutro Abs: 13.3 10*3/uL — ABNORMAL HIGH (ref 1.7–7.7)
Neutrophils Relative %: 88 %
Platelets: 210 10*3/uL (ref 150–400)
RBC: 3.4 MIL/uL — ABNORMAL LOW (ref 4.22–5.81)
RDW: 12.9 % (ref 11.5–15.5)
WBC: 15.1 10*3/uL — ABNORMAL HIGH (ref 4.0–10.5)

## 2015-11-07 LAB — BASIC METABOLIC PANEL
ANION GAP: 11 (ref 5–15)
BUN: 57 mg/dL — ABNORMAL HIGH (ref 6–20)
CALCIUM: 8.5 mg/dL — AB (ref 8.9–10.3)
CO2: 24 mmol/L (ref 22–32)
CREATININE: 3.81 mg/dL — AB (ref 0.61–1.24)
Chloride: 101 mmol/L (ref 101–111)
GFR calc non Af Amer: 13 mL/min — ABNORMAL LOW (ref >60)
GFR, EST AFRICAN AMERICAN: 15 mL/min — AB (ref >60)
Glucose, Bld: 201 mg/dL — ABNORMAL HIGH (ref 65–99)
Potassium: 5.3 mmol/L — ABNORMAL HIGH (ref 3.5–5.1)
Sodium: 136 mmol/L (ref 135–145)

## 2015-11-07 MED ORDER — TRAMADOL HCL 50 MG PO TABS: 50.0000 mg | ORAL_TABLET | Freq: Four times a day (QID) | ORAL | Status: DC | PRN

## 2015-11-07 NOTE — Discharge Instructions (Signed)
Use heat on the sore area of your left shoulder, 3 or 4 times a day for 30 minutes. Use the pain medicine as directed. Stop taking the Aleve because it can harm your kidney. Use a stool softener while you're taking the pain medication.   Contusion A contusion is a deep bruise. Contusions are the result of a blunt injury to tissues and muscle fibers under the skin. The injury causes bleeding under the skin. The skin overlying the contusion may turn blue, purple, or yellow. Minor injuries will give you a painless contusion, but more severe contusions may stay painful and swollen for a few weeks.  CAUSES  This condition is usually caused by a blow, trauma, or direct force to an area of the body. SYMPTOMS  Symptoms of this condition include:  Swelling of the injured area.  Pain and tenderness in the injured area.  Discoloration. The area may have redness and then turn blue, purple, or yellow. DIAGNOSIS  This condition is diagnosed based on a physical exam and medical history. An X-ray, CT scan, or MRI may be needed to determine if there are any associated injuries, such as broken bones (fractures). TREATMENT  Specific treatment for this condition depends on what area of the body was injured. In general, the best treatment for a contusion is resting, icing, applying pressure to (compression), and elevating the injured area. This is often called the RICE strategy. Over-the-counter anti-inflammatory medicines may also be recommended for pain control.  HOME CARE INSTRUCTIONS   Rest the injured area.  If directed, apply ice to the injured area:  Put ice in a plastic bag.  Place a towel between your skin and the bag.  Leave the ice on for 20 minutes, 2-3 times per day.  If directed, apply light compression to the injured area using an elastic bandage. Make sure the bandage is not wrapped too tightly. Remove and reapply the bandage as directed by your health care provider.  If possible,  raise (elevate) the injured area above the level of your heart while you are sitting or lying down.  Take over-the-counter and prescription medicines only as told by your health care provider. SEEK MEDICAL CARE IF:  Your symptoms do not improve after several days of treatment.  Your symptoms get worse.  You have difficulty moving the injured area. SEEK IMMEDIATE MEDICAL CARE IF:   You have severe pain.  You have numbness in a hand or foot.  Your hand or foot turns pale or cold.   This information is not intended to replace advice given to you by your health care provider. Make sure you discuss any questions you have with your health care provider.   Document Released: 04/09/2005 Document Revised: 03/21/2015 Document Reviewed: 11/15/2014 Elsevier Interactive Patient Education [employer].

## 2015-11-07 NOTE — ED Notes (Signed)
PT reports he did not take his AM BP meds.

## 2015-11-07 NOTE — ED Notes (Signed)
Pt sts fall on Sunday onto back and hit head; pt denies LOC; pt sts HA and left shoulder pain

## 2015-11-07 NOTE — ED Provider Notes (Signed)
CSN: [id number]     Arrival date & time 11/07/15  5427 History   First MD Initiated Contact with Patient 11/07/15 [phone]     Chief Complaint  Patient presents with  . Fall     (Consider location/radiation/quality/duration/timing/severity/associated sxs/prior Treatment) HPI   [name] is a 82 y.o. male patient had a slip and fall in shower, 3 days ago, injuring his left shoulder. He fell backwards hitting the back of his head and his left shoulder on the floor. Since then he has had days when he had no pain, and some days when he has had pain. Last night he could not sleep because of pain in his left shoulder. He denies weakness, dizziness, nausea, vomiting, fever, chills, shortness of breath or change in bowel and urinary habits. He is  using Aleve every 3 hours, without relief of his pain. He is due to see his vascular doctor soon for staging for fistula placement. His recent creatinine, at his nephrologist office was 3.8.   Past Medical History  Diagnosis Date  . Diabetes mellitus   . Hypertension   . Hypercholesteremia   . Stroke ([place])   . PNA (pneumonia)   . Detached retina, right   . Cancer ([place])     lung ca dx'd 2008   Past Surgical History  Procedure Laterality Date  . Appendectomy     Family History  Problem Relation Age of Onset  . Cancer Mother   . Cancer Brother    Social History  Substance Use Topics  . Smoking status: Former Smoker -- 1.00 packs/day for 73 years    Quit date: 07/14/2004  . Smokeless tobacco: None  . Alcohol Use: No    Review of Systems  All other systems reviewed and are negative.     Allergies  Review of patient's allergies indicates no known allergies.  Home Medications   Prior to Admission medications   Medication Sig Start Date End Date Taking? Authorizing Provider  aspirin 81 MG tablet Take 81 mg by mouth daily.   Yes Historical Provider, MD  B Complex-Folic Acid (SUPER B COMPLEX MAXI) TABS Take 1 tablet by mouth daily.    Yes Historical Provider, MD  hydrochlorothiazide (HYDRODIURIL) 25 MG tablet Take 25 mg by mouth daily. 10/16/15  Yes Historical Provider, MD  insulin detemir (LEVEMIR) 100 UNIT/ML injection Inject 30 Units into the skin at bedtime. Reported on 07/02/2015   Yes Historical Provider, MD  insulin lispro (HUMALOG) 100 UNIT/ML injection Inject 20 Units into the skin every evening. 20 units in evening only   Yes Historical Provider, MD  lisinopril-hydrochlorothiazide (PRINZIDE,ZESTORETIC) 10-12.5 MG per tablet Take 1 tablet by mouth daily. Reported on 07/02/2015    Historical Provider, MD  traMADol (ULTRAM) 50 MG tablet Take 1 tablet (50 mg total) by mouth every 6 (six) hours as needed. 11/07/15   Daleen Bo, MD   BP 172/101 mmHg  Pulse 99  Temp(Src) 98 F (36.7 C) (Oral)  Resp 18  SpO2 100% Physical Exam  Constitutional: He is oriented to person, place, and time. He appears well-developed.  Elderly, frail  HENT:  Head: Normocephalic and atraumatic.  Right Ear: External ear normal.  Left Ear: External ear normal.  Eyes: Conjunctivae and EOM are normal. Pupils are equal, round, and reactive to light.  Neck: Normal range of motion and phonation normal. Neck supple.  Cardiovascular: Normal rate, regular rhythm and normal heart sounds.   Pulmonary/Chest: Effort normal and breath sounds normal. He exhibits  no tenderness and no bony tenderness.  Abdominal: Soft. There is no tenderness.  Musculoskeletal: Normal range of motion.  No range of motion, arms and legs bilaterally. Mild diffuse left shoulder tenderness, without palpable deformity or limitation of range of motion. Mild bruising noted over the left posterior shoulder and left upper arm.  Neurological: He is alert and oriented to person, place, and time. No cranial nerve deficit or sensory deficit. He exhibits normal muscle tone. Coordination normal.  Skin: Skin is warm, dry and intact.  Psychiatric: He has a normal mood and affect. His  behavior is normal. Judgment and thought content normal.  Nursing note and vitals reviewed.   ED Course  Procedures (including critical care time)  Medications - No data to display  Patient Vitals for the past 24 hrs:  BP Temp Temp src Pulse Resp SpO2  11/07/15 1015 (!) 172/101 mmHg - - 99 - 100 %  11/07/15 1000 (!) 177/102 mmHg - - 99 - 100 %  11/07/15 0945 (!) 204/107 mmHg - - 94 - 100 %  11/07/15 0805 (!) 204/95 mmHg 98 F (36.7 C) Oral 109 18 98 %    10:12 AM Reevaluation with update and discussion. After initial assessment and treatment, an updated evaluation reveals Patient remains comfortable. Findings discussed with patient and wife, all questions were answered. Morenike Cuff L    Labs Review Labs Reviewed  CBC WITH DIFFERENTIAL/PLATELET - Abnormal; Notable for the following:    WBC 15.1 (*)    RBC 3.40 (*)    Hemoglobin 11.0 (*)    HCT 32.6 (*)    Neutro Abs 13.3 (*)    All other components within normal limits  BASIC METABOLIC PANEL - Abnormal; Notable for the following:    Potassium 5.3 (*)    Glucose, Bld 201 (*)    BUN 57 (*)    Creatinine, Ser 3.81 (*)    Calcium 8.5 (*)    GFR calc non Af Amer 13 (*)    GFR calc Af Amer 15 (*)    All other components within normal limits  I-STAT TROPOININ, ED    Imaging Review Dg Shoulder Left  11/07/2015  CLINICAL DATA:  Left shoulder pain after a fall in the shower 4 days ago. EXAM: LEFT SHOULDER - 2+ VIEW COMPARISON:  None. FINDINGS: There is no evidence of fracture or dislocation. Osteopenia. Slight degenerative changes of the glenoid. Incidental note of dense calcification in the aortic arch. IMPRESSION: No acute abnormalities. Slight arthritis of the glenohumeral joint. Aortic atherosclerosis. Electronically Signed   By: Lorriane Shire M.D.   On: 11/07/2015 08:46   I have personally reviewed and evaluated these images and lab results as part of my medical decision-making.   EKG Interpretation None      MDM    Final diagnoses:  Contusion of left shoulder, initial encounter    Evaluation is consistent with non-specific injury to the left shoulder, most likely contusion related. There is no clear evidence for rotator cuff injury, and no suspected fracture. Doubt spinal source for this discomfort. Doubt significant head or neck injury.  Nursing Notes Reviewed/ Care Coordinated Applicable Imaging Reviewed Interpretation of Laboratory Data incorporated into ED treatment  The patient appears reasonably screened and/or stabilized for discharge and I doubt any other medical condition or other [facility] requiring further screening, evaluation, or treatment in the ED at this time prior to discharge.  Plan: Home Medications- Tramadol; Home Treatments- Heat; return here if the recommended treatment, does not  improve the symptoms; Recommended follow up- PCP prn     Daleen Bo, MD 11/07/15 1043

## 2015-11-09 ENCOUNTER — Ambulatory Visit (HOSPITAL_COMMUNITY)
Admission: RE | Admit: 2015-11-09 | Discharge: 2015-11-09 | Disposition: A | Discharge status: Home / Self Care | Payer: Medicare Other | Source: Ambulatory Visit | Attending: Vascular Surgery | Admitting: Vascular Surgery

## 2015-11-09 ENCOUNTER — Ambulatory Visit (INDEPENDENT_AMBULATORY_CARE_PROVIDER_SITE_OTHER): Payer: Medicare Other | Admitting: Vascular Surgery

## 2015-11-09 ENCOUNTER — Ambulatory Visit (INDEPENDENT_AMBULATORY_CARE_PROVIDER_SITE_OTHER)
Admission: RE | Admit: 2015-11-09 | Discharge: 2015-11-09 | Disposition: A | Discharge status: Home / Self Care | Payer: Medicare Other | Source: Ambulatory Visit | Attending: Vascular Surgery | Admitting: Vascular Surgery

## 2015-11-09 ENCOUNTER — Encounter: Payer: Self-pay | Admitting: Vascular Surgery

## 2015-11-09 VITALS — BP 164/93 | HR 90 | Temp 97.4°F | Resp 16 | Ht 69.0 in | Wt 196.0 lb

## 2015-11-09 DIAGNOSIS — I639 Cerebral infarction, unspecified: Secondary | ICD-10-CM | POA: Insufficient documentation

## 2015-11-09 DIAGNOSIS — E1122 Type 2 diabetes mellitus with diabetic chronic kidney disease: Secondary | ICD-10-CM | POA: Insufficient documentation

## 2015-11-09 DIAGNOSIS — N184 Chronic kidney disease, stage 4 (severe): Secondary | ICD-10-CM

## 2015-11-09 DIAGNOSIS — I120 Hypertensive chronic kidney disease with stage 5 chronic kidney disease or end stage renal disease: Secondary | ICD-10-CM | POA: Diagnosis not present

## 2015-11-09 DIAGNOSIS — Z01810 Encounter for preprocedural cardiovascular examination: Secondary | ICD-10-CM | POA: Insufficient documentation

## 2015-11-09 DIAGNOSIS — E7800 Pure hypercholesterolemia, unspecified: Secondary | ICD-10-CM | POA: Insufficient documentation

## 2015-11-09 DIAGNOSIS — N185 Chronic kidney disease, stage 5: Secondary | ICD-10-CM | POA: Insufficient documentation

## 2015-11-09 NOTE — Progress Notes (Signed)
[facility] [place]  Patient name: [name] MRN: [id number] DOB: 01/27/1928 Sex: male  REASON FOR CONSULT: To evaluate for hemodialysis access. Referred by Dr. Erling Cruz  HPI: [name] is a 82 y.o. male, who is referred for evaluation for hemodialysis access. He is right-handed. He is not yet on dialysis. He denies any recent uremic symptoms. Specifically, he denies nausea, vomiting, fatigue, anorexia, or palpitations.  Past Medical History  Diagnosis Date  . Diabetes mellitus   . Hypertension   . Hypercholesteremia   . Stroke ([place])   . PNA (pneumonia)   . Detached retina, right   . Cancer ([place])     lung ca dx'd 2008    Family History  Problem Relation Age of Onset  . Cancer Mother   . Cancer Brother     SOCIAL HISTORY: Social History   Social History  . Marital Status: Married    Spouse Name: N/A  . Number of Children: N/A  . Years of Education: N/A   Occupational History  . Not on file.   Social History Main Topics  . Smoking status: Former Smoker -- 1.00 packs/day for 36 years    Quit date: 07/14/2004  . Smokeless tobacco: Not on file  . Alcohol Use: No  . Drug Use: No  . Sexual Activity: Not on file   Other Topics Concern  . Not on file   Social History Narrative    No Known Allergies  Current Outpatient Prescriptions  Medication Sig Dispense Refill  . aspirin 81 MG tablet Take 81 mg by mouth daily.    . B Complex-Folic Acid (SUPER B COMPLEX MAXI) TABS Take 1 tablet by mouth daily.    . hydrochlorothiazide (HYDRODIURIL) 25 MG tablet Take 25 mg by mouth daily.    . insulin detemir (LEVEMIR) 100 UNIT/ML injection Inject 30 Units into the skin at bedtime. Reported on 07/02/2015    . insulin lispro (HUMALOG) 100 UNIT/ML injection Inject 20 Units into the skin every evening. 20 units in evening only    . lisinopril-hydrochlorothiazide (PRINZIDE,ZESTORETIC) 10-12.5 MG per tablet Take 1 tablet by mouth daily.  Reported on 07/02/2015    . traMADol (ULTRAM) 50 MG tablet Take 1 tablet (50 mg total) by mouth every 6 (six) hours as needed. 30 tablet 0   No current facility-administered medications for this visit.    REVIEW OF SYSTEMS:  '[X]'$  denotes positive finding, '[ ]'$  denotes negative finding Cardiac  Comments:  Chest pain or chest pressure:    Shortness of breath upon exertion:    Short of breath when lying flat:    Irregular heart rhythm:        Vascular    Pain in calf, thigh, or hip brought on by ambulation:    Pain in feet at night that wakes you up from your sleep:     Blood clot in your veins:    Leg swelling:         Pulmonary    Oxygen at home:    Productive cough:     Wheezing:         Neurologic    Sudden weakness in arms or legs:     Sudden numbness in arms or legs:     Sudden onset of difficulty speaking or slurred speech:    Temporary loss of vision in one eye:   Blindness right eye/permanent  Problems with dizziness:         Gastrointestinal  Blood in stool:     Vomited blood:         Genitourinary    Burning when urinating:     Blood in urine:        Psychiatric    Major depression:         Hematologic    Bleeding problems:    Problems with blood clotting too easily:        Skin    Rashes or ulcers:        Constitutional    Fever or chills:      PHYSICAL EXAM: Filed Vitals:   11/09/15 0952 11/09/15 0953  BP: 187/96 164/93  Pulse: 90 90  Temp: 97.4 F (36.3 C)   TempSrc: Oral   Resp: 16   Height: '5\' 9"'$  (1.753 m)   Weight: 196 lb (88.905 kg)   SpO2: 99%     GENERAL: The patient is a well-nourished male, in no acute distress. The vital signs are documented above. CARDIAC: There is a regular rate and rhythm.  VASCULAR: I do not detect carotid bruits. He has palpable brachial and radial pulses bilaterally. PULMONARY: There is good air exchange bilaterally without wheezing or rales. ABDOMEN: Soft and non-tender with normal pitched bowel  sounds.  MUSCULOSKELETAL: There are no major deformities or cyanosis. NEUROLOGIC: No focal weakness or paresthesias are detected. SKIN: There are no ulcers or rashes noted. PSYCHIATRIC: The patient has a normal affect.  DATA:   UPPER EXTREMITY ARTERIAL DUPLEX: I have independently interpreted his upper extremity arterial duplex scan.  On the left side, he has a triphasic radial and ulnar signal with the Doppler. Brachial artery measures 0.57 cm in intraluminal diameter.  On the right side, he has a triphasic radial signal. The ulnar artery is not identified.  UPPER EXTREMITY VEIN MAP: I have independently interpreted his upper extremity vein map.  On the left side, the upper arm cephalic vein appears to be adequate size for a fistula. The forearm cephalic vein is marginal in size and the basilic vein is marginal in size.  MEDICAL ISSUES:  STAGE IV CHRONIC KIDNEY DISEASE: He appears to be a good candidate for a left brachial cephalic AV fistula. I have explained the indications for placement of an AV fistula or AV graft. I've explained that if at all possible we will place an AV fistula.  I have reviewed the risks of placement of an AV fistula including but not limited to: failure of the fistula to mature, need for subsequent interventions, and thrombosis. In addition I have reviewed the potential complications of placement of an AV graft. These risks include, but are not limited to, graft thrombosis, graft infection, wound healing problems, bleeding, arm swelling, and steal syndrome. All the patient's questions were answered and they are agreeable to proceed with surgery. We have tentatively scheduled his fistula for 12/04/2015.  HYPERTENSION: The patient's initial blood pressure today was elevated. We repeated this and this was still elevated. We have encouraged the patient to follow up with their primary care physician for management of their blood pressure.  Deitra Mayo [facility] [place]: [phone]

## 2015-11-12 ENCOUNTER — Emergency Department (HOSPITAL_COMMUNITY): Payer: Medicare Other

## 2015-11-12 ENCOUNTER — Observation Stay (HOSPITAL_COMMUNITY)
Admission: EM | Admit: 2015-11-12 | Discharge: 2015-11-14 | Disposition: A | Discharge status: Home / Self Care | Payer: Medicare Other | Attending: Internal Medicine | Admitting: Internal Medicine

## 2015-11-12 ENCOUNTER — Encounter (HOSPITAL_COMMUNITY): Payer: Self-pay | Admitting: *Deleted

## 2015-11-12 DIAGNOSIS — N189 Chronic kidney disease, unspecified: Secondary | ICD-10-CM | POA: Diagnosis present

## 2015-11-12 DIAGNOSIS — Z85110 Personal history of malignant carcinoid tumor of bronchus and lung: Secondary | ICD-10-CM | POA: Diagnosis not present

## 2015-11-12 DIAGNOSIS — Z79899 Other long term (current) drug therapy: Secondary | ICD-10-CM | POA: Diagnosis not present

## 2015-11-12 DIAGNOSIS — E7800 Pure hypercholesterolemia, unspecified: Secondary | ICD-10-CM | POA: Insufficient documentation

## 2015-11-12 DIAGNOSIS — C3431 Malignant neoplasm of lower lobe, right bronchus or lung: Secondary | ICD-10-CM | POA: Diagnosis not present

## 2015-11-12 DIAGNOSIS — D72829 Elevated white blood cell count, unspecified: Secondary | ICD-10-CM | POA: Diagnosis not present

## 2015-11-12 DIAGNOSIS — N179 Acute kidney failure, unspecified: Secondary | ICD-10-CM | POA: Diagnosis present

## 2015-11-12 DIAGNOSIS — D631 Anemia in chronic kidney disease: Secondary | ICD-10-CM | POA: Insufficient documentation

## 2015-11-12 DIAGNOSIS — Z7982 Long term (current) use of aspirin: Secondary | ICD-10-CM | POA: Diagnosis not present

## 2015-11-12 DIAGNOSIS — Z794 Long term (current) use of insulin: Secondary | ICD-10-CM | POA: Diagnosis not present

## 2015-11-12 DIAGNOSIS — N186 End stage renal disease: Secondary | ICD-10-CM | POA: Diagnosis not present

## 2015-11-12 DIAGNOSIS — I120 Hypertensive chronic kidney disease with stage 5 chronic kidney disease or end stage renal disease: Secondary | ICD-10-CM | POA: Diagnosis not present

## 2015-11-12 DIAGNOSIS — Z8701 Personal history of pneumonia (recurrent): Secondary | ICD-10-CM | POA: Diagnosis not present

## 2015-11-12 DIAGNOSIS — Z8673 Personal history of transient ischemic attack (TIA), and cerebral infarction without residual deficits: Secondary | ICD-10-CM | POA: Diagnosis not present

## 2015-11-12 DIAGNOSIS — Z87891 Personal history of nicotine dependence: Secondary | ICD-10-CM | POA: Diagnosis not present

## 2015-11-12 DIAGNOSIS — E119 Type 2 diabetes mellitus without complications: Secondary | ICD-10-CM

## 2015-11-12 DIAGNOSIS — R51 Headache: Secondary | ICD-10-CM | POA: Diagnosis present

## 2015-11-12 DIAGNOSIS — Z992 Dependence on renal dialysis: Secondary | ICD-10-CM | POA: Diagnosis not present

## 2015-11-12 DIAGNOSIS — E118 Type 2 diabetes mellitus with unspecified complications: Secondary | ICD-10-CM | POA: Insufficient documentation

## 2015-11-12 DIAGNOSIS — I10 Essential (primary) hypertension: Secondary | ICD-10-CM | POA: Diagnosis present

## 2015-11-12 LAB — BASIC METABOLIC PANEL
Anion gap: 16 — ABNORMAL HIGH (ref 5–15)
BUN: 67 mg/dL — AB (ref 6–20)
CO2: 20 mmol/L — ABNORMAL LOW (ref 22–32)
Calcium: 8.7 mg/dL — ABNORMAL LOW (ref 8.9–10.3)
Chloride: 97 mmol/L — ABNORMAL LOW (ref 101–111)
Creatinine, Ser: 4.46 mg/dL — ABNORMAL HIGH (ref 0.61–1.24)
GFR calc Af Amer: 13 mL/min — ABNORMAL LOW (ref >60)
GFR, EST NON AFRICAN AMERICAN: 11 mL/min — AB (ref >60)
Glucose, Bld: 106 mg/dL — ABNORMAL HIGH (ref 65–99)
POTASSIUM: 5.2 mmol/L — AB (ref 3.5–5.1)
SODIUM: 133 mmol/L — AB (ref 135–145)

## 2015-11-12 LAB — COMPREHENSIVE METABOLIC PANEL
ALT: 20 U/L (ref 17–63)
AST: 28 U/L (ref 15–41)
Albumin: 3.2 g/dL — ABNORMAL LOW (ref 3.5–5.0)
Alkaline Phosphatase: 80 U/L (ref 38–126)
Anion gap: 16 — ABNORMAL HIGH (ref 5–15)
BUN: 64 mg/dL — ABNORMAL HIGH (ref 6–20)
CHLORIDE: 98 mmol/L — AB (ref 101–111)
CO2: 19 mmol/L — AB (ref 22–32)
Calcium: 8.1 mg/dL — ABNORMAL LOW (ref 8.9–10.3)
Creatinine, Ser: 4.23 mg/dL — ABNORMAL HIGH (ref 0.61–1.24)
GFR, EST AFRICAN AMERICAN: 13 mL/min — AB (ref >60)
GFR, EST NON AFRICAN AMERICAN: 12 mL/min — AB (ref >60)
Glucose, Bld: 131 mg/dL — ABNORMAL HIGH (ref 65–99)
POTASSIUM: 4.4 mmol/L (ref 3.5–5.1)
SODIUM: 133 mmol/L — AB (ref 135–145)
Total Bilirubin: 0.5 mg/dL (ref 0.3–1.2)
Total Protein: 6.9 g/dL (ref 6.5–8.1)

## 2015-11-12 LAB — CBC WITH DIFFERENTIAL/PLATELET
BASOS ABS: 0 10*3/uL (ref 0.0–0.1)
Basophils Relative: 0 %
EOS ABS: 0.1 10*3/uL (ref 0.0–0.7)
EOS PCT: 1 %
HCT: 34.1 % — ABNORMAL LOW (ref 39.0–52.0)
Hemoglobin: 11.5 g/dL — ABNORMAL LOW (ref 13.0–17.0)
LYMPHS ABS: 1.3 10*3/uL (ref 0.7–4.0)
LYMPHS PCT: 11 %
MCH: 32.1 pg (ref 26.0–34.0)
MCHC: 33.7 g/dL (ref 30.0–36.0)
MCV: 95.3 fL (ref 78.0–100.0)
MONO ABS: 0.6 10*3/uL (ref 0.1–1.0)
Monocytes Relative: 5 %
Neutro Abs: 9.8 10*3/uL — ABNORMAL HIGH (ref 1.7–7.7)
Neutrophils Relative %: 83 %
PLATELETS: 241 10*3/uL (ref 150–400)
RBC: 3.58 MIL/uL — ABNORMAL LOW (ref 4.22–5.81)
RDW: 12.6 % (ref 11.5–15.5)
WBC: 11.8 10*3/uL — AB (ref 4.0–10.5)

## 2015-11-12 LAB — PROTIME-INR
INR: 1.15 (ref 0.00–1.49)
PROTHROMBIN TIME: 14.9 s (ref 11.6–15.2)

## 2015-11-12 LAB — CBC
HCT: 35.9 % — ABNORMAL LOW (ref 39.0–52.0)
Hemoglobin: 12.2 g/dL — ABNORMAL LOW (ref 13.0–17.0)
MCH: 33 pg (ref 26.0–34.0)
MCHC: 34 g/dL (ref 30.0–36.0)
MCV: 97 fL (ref 78.0–100.0)
Platelets: 275 10*3/uL (ref 150–400)
RBC: 3.7 MIL/uL — ABNORMAL LOW (ref 4.22–5.81)
RDW: 12.8 % (ref 11.5–15.5)
WBC: 13 10*3/uL — AB (ref 4.0–10.5)

## 2015-11-12 LAB — LACTIC ACID, PLASMA
LACTIC ACID, VENOUS: 1.1 mmol/L (ref 0.5–2.0)
LACTIC ACID, VENOUS: 0.9 mmol/L (ref 0.5–2.0)

## 2015-11-12 LAB — I-STAT TROPONIN, ED: Troponin i, poc: 0.02 ng/mL (ref 0.00–0.08)

## 2015-11-12 LAB — CBG MONITORING, ED
GLUCOSE-CAPILLARY: 123 mg/dL — AB (ref 65–99)
GLUCOSE-CAPILLARY: 193 mg/dL — AB (ref 65–99)

## 2015-11-12 LAB — PROCALCITONIN: Procalcitonin: 0.23 ng/mL

## 2015-11-12 LAB — GLUCOSE, CAPILLARY: GLUCOSE-CAPILLARY: 175 mg/dL — AB (ref 65–99)

## 2015-11-12 MED ORDER — SODIUM CHLORIDE 0.9% FLUSH
3.0000 mL | Freq: Two times a day (BID) | INTRAVENOUS | Status: DC
  Administered 2015-11-12 – 2015-11-14 (×2): 3 mL via INTRAVENOUS

## 2015-11-12 MED ORDER — ASPIRIN EC 81 MG PO TBEC
81.0000 mg | DELAYED_RELEASE_TABLET | Freq: Every day | ORAL | Status: DC
  Administered 2015-11-12 – 2015-11-14 (×3): 81 mg via ORAL
  Filled 2015-11-12 (×3): qty 1

## 2015-11-12 MED ORDER — ALUM & MAG HYDROXIDE-SIMETH 200-200-20 MG/5ML PO SUSP: 15.0000 mL | ORAL | Status: DC | PRN

## 2015-11-12 MED ORDER — ONDANSETRON HCL 4 MG PO TABS: 4.0000 mg | ORAL_TABLET | Freq: Four times a day (QID) | ORAL | Status: DC | PRN

## 2015-11-12 MED ORDER — ACETAMINOPHEN 650 MG RE SUPP: 650.0000 mg | Freq: Four times a day (QID) | RECTAL | Status: DC | PRN

## 2015-11-12 MED ORDER — CLONIDINE HCL 0.2 MG PO TABS
0.2000 mg | ORAL_TABLET | Freq: Once | ORAL | Status: AC
  Administered 2015-11-12: 0.2 mg via ORAL
  Filled 2015-11-12: qty 1

## 2015-11-12 MED ORDER — METOPROLOL SUCCINATE ER 25 MG PO TB24
12.5000 mg | ORAL_TABLET | Freq: Every day | ORAL | Status: DC
  Administered 2015-11-12 – 2015-11-14 (×3): 12.5 mg via ORAL
  Filled 2015-11-12 (×3): qty 1

## 2015-11-12 MED ORDER — SENNOSIDES-DOCUSATE SODIUM 8.6-50 MG PO TABS: 1.0000 | ORAL_TABLET | Freq: Every evening | ORAL | Status: DC | PRN

## 2015-11-12 MED ORDER — ACETAMINOPHEN 325 MG PO TABS
650.0000 mg | ORAL_TABLET | Freq: Four times a day (QID) | ORAL | Status: DC | PRN
  Administered 2015-11-12 – 2015-11-14 (×5): 650 mg via ORAL
  Filled 2015-11-12 (×6): qty 2

## 2015-11-12 MED ORDER — TRAZODONE HCL 50 MG PO TABS: 25.0000 mg | ORAL_TABLET | Freq: Every evening | ORAL | Status: DC | PRN

## 2015-11-12 MED ORDER — HYDRALAZINE HCL 20 MG/ML IJ SOLN: 5.0000 mg | Freq: Four times a day (QID) | INTRAMUSCULAR | Status: DC | PRN

## 2015-11-12 MED ORDER — ACETAMINOPHEN 325 MG PO TABS
650.0000 mg | ORAL_TABLET | Freq: Once | ORAL | Status: AC
  Administered 2015-11-12: 650 mg via ORAL
  Filled 2015-11-12: qty 2

## 2015-11-12 MED ORDER — HYDROCODONE-ACETAMINOPHEN 5-325 MG PO TABS: 1.0000 | ORAL_TABLET | ORAL | Status: DC | PRN

## 2015-11-12 MED ORDER — SODIUM CHLORIDE 0.9 % IV BOLUS (SEPSIS)
500.0000 mL | Freq: Once | INTRAVENOUS | Status: AC
  Administered 2015-11-12: 500 mL via INTRAVENOUS

## 2015-11-12 MED ORDER — ALUM & MAG HYDROXIDE-SIMETH 200-200-20 MG/5ML PO SUSP
15.0000 mL | Freq: Once | ORAL | Status: AC
  Administered 2015-11-12: 15 mL via ORAL
  Filled 2015-11-12: qty 30

## 2015-11-12 MED ORDER — SODIUM CHLORIDE 0.9 % IV SOLN
INTRAVENOUS | Status: DC
  Administered 2015-11-12 – 2015-11-14 (×4): via INTRAVENOUS

## 2015-11-12 MED ORDER — HEPARIN SODIUM (PORCINE) 5000 UNIT/ML IJ SOLN
5000.0000 [IU] | Freq: Three times a day (TID) | INTRAMUSCULAR | Status: DC
  Administered 2015-11-12 – 2015-11-14 (×5): 5000 [IU] via SUBCUTANEOUS
  Filled 2015-11-12 (×5): qty 1

## 2015-11-12 MED ORDER — ONDANSETRON HCL 4 MG/2ML IJ SOLN
4.0000 mg | Freq: Four times a day (QID) | INTRAMUSCULAR | Status: DC | PRN
Start: 2015-11-12 — End: 2015-11-14

## 2015-11-12 MED ORDER — BISACODYL 10 MG RE SUPP: 10.0000 mg | Freq: Every day | RECTAL | Status: DC | PRN

## 2015-11-12 MED ORDER — INSULIN ASPART 100 UNIT/ML ~~LOC~~ SOLN
0.0000 [IU] | Freq: Three times a day (TID) | SUBCUTANEOUS | Status: DC
  Administered 2015-11-12: 2 [IU] via SUBCUTANEOUS
  Administered 2015-11-12: 1 [IU] via SUBCUTANEOUS
  Administered 2015-11-13: 2 [IU] via SUBCUTANEOUS
  Administered 2015-11-13 – 2015-11-14 (×3): 1 [IU] via SUBCUTANEOUS
  Filled 2015-11-12 (×2): qty 1

## 2015-11-12 MED ORDER — ALUM & MAG HYDROXIDE-SIMETH 200-200-20 MG/5ML PO SUSP
15.0000 mL | Freq: Once | ORAL | Status: AC
  Administered 2015-11-13: 15 mL via ORAL
  Filled 2015-11-12: qty 30

## 2015-11-12 NOTE — H&P (Signed)
History and Physical    KEY CEN VZD:[id number] DOB: 10-14-1927 DOA: 11/12/2015  Referring MD/NP/PA: EDP PCP: Mayra Neer, MD  Outpatient Specialists: Patient Care Team: Estanislado Emms, MD as Consulting Physician (Nephrology) Hurman Horn, MD as Consulting Physician (Ophthalmology) [place]  Patient coming from:  Home  Chief Complaint: Headache   HPI: [name] is a 82 y.o. male with medical history significant for HTN, CKD Stage 4, DM insulin dependent, history of St 1 A Lung Cancer not on therapy, presenting with severe headache, which began early this morning. This was preceded by decreased oral intake since yesterday and acute  acute episode of confusion this morning which  quickly resolved. The symptoms follow the recent visit to the emergency department after he had a fall and on his left arm-without hitting his head-, with negative x-ray and discharged on the same day home with pain medications in the form of Ultram.  Denies fevers, chills, night sweats, or mucositis. Denies any respiratory complaints. Denies any chest pain or palpitations. Denies lower extremity swelling. Denies nausea, heartburn or change in bowel habits. Denies abdominal pain. Denies any dysuria but has decreased urine output in the setting of chronic kidney disease. Denies abnormal skin rashes, or neuropathy. Denies any bleeding issues such as epistaxis, hematemesis, hematuria or hematochezia.   ED Course:  His BP increased from   BP 189/91 to BP 215/111 mmHg  Pulse 106  for which he received Hydralazine 10 mg with some response, now increasing again to 192/102 P 95  He reports taking his BP meds this am.  He is Afebrile and is not hypoxic. He is alert and conversant, main complaint is cervical and occipital headache. CT head is negative for acute intracranial process. CMET is abnormal with Na 133, K 5.2 EGFR is 11. CBC remarkable for WBC 13. Glu 106.  Will be admitted for obs  and management of symptomatic malignant HTN.  Review of Systems: As per HPI otherwise 10 point review of systems negative.   Past Medical History  Diagnosis Date  . Diabetes mellitus   . Hypertension   . Hypercholesteremia   . Stroke ([place])   . PNA (pneumonia)   . Detached retina, right   . Cancer ([place])     lung ca dx'd 2008    Past Surgical History  Procedure Laterality Date  . Appendectomy       reports that he quit smoking about 11 years ago. He does not have any smokeless tobacco history on file. He reports that he does not drink alcohol or use illicit drugs.  No Known Allergies  Family History  Problem Relation Age of Onset  . Cancer Mother   . Cancer Brother     Family history reviewed and not pertinent (If you reviewed it)  Prior to Admission medications   Medication Sig Start Date End Date Taking? Authorizing Provider  acetaminophen (TYLENOL) 500 MG tablet Take 1,000 mg by mouth every 8 (eight) hours as needed for moderate pain.   Yes Historical Provider, MD  aspirin 81 MG tablet Take 81 mg by mouth daily.   Yes Historical Provider, MD  B Complex-Folic Acid (SUPER B COMPLEX MAXI) TABS Take 1 tablet by mouth daily.   Yes Historical Provider, MD  hydrochlorothiazide (HYDRODIURIL) 25 MG tablet Take 25 mg by mouth daily. 10/16/15  Yes Historical Provider, MD  insulin detemir (LEVEMIR) 100 UNIT/ML injection Inject 30 Units into the skin at bedtime. Reported on 07/02/2015  Yes Historical Provider, MD  insulin lispro (HUMALOG) 100 UNIT/ML injection Inject 20 Units into the skin every evening. 20 units in evening only   Yes Historical Provider, MD  traMADol (ULTRAM) 50 MG tablet Take 1 tablet (50 mg total) by mouth every 6 (six) hours as needed. 11/07/15  Yes Daleen Bo, MD    Physical Exam:    Filed Vitals:   11/12/15 0544 11/12/15 0830 11/12/15 0930 11/12/15 1000  BP: 189/91 213/100 203/107 215/111  Pulse:  91 95 106  Temp:      TempSrc:      Resp:  12 14     Height:      Weight:      SpO2:  99% 99% 100%      Constitutional: NAD, calm, comfortable Filed Vitals:   11/12/15 0544 11/12/15 0830 11/12/15 0930 11/12/15 1000  BP: 189/91 213/100 203/107 215/111  Pulse:  91 95 106  Temp:      TempSrc:      Resp:  12 14   Height:      Weight:      SpO2:  99% 99% 100%   Eyes: Left PERRL, lids and conjunctivae normal. 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  ENMT: Mucous membranes are dry . Posterior pharynx clear of any exudate or lesions.Normal dentition.  Neck: normal, supple, no masses, no thyromegaly Respiratory: clear to auscultation bilaterally, no wheezing, no crackles. Normal respiratory effort. No accessory muscle use.  Cardiovascular: Regular rate and rhythm, +  2/6 high pitch  murmurs /  No rubs / gallops. No extremity edema. 2+ pedal pulses. No carotid bruits.  Abdomen: no tenderness, no masses palpated. No hepatosplenomegaly. Bowel sounds positive.  Musculoskeletal: no clubbing / cyanosis. No joint deformity upper and lower extremities. Good ROM, no contractures. Normal muscle tone.  Skin: no rashes, lesions, ulcers. No induration. Some bruising on the left arm from recent fall.  Neurologic: CN 2-12 grossly intact. Sensation intact, DTR normal. Strength 5/5 in all 4.  Psychiatric: Normal judgment and insight. Alert and oriented x 3. Normal mood.     Labs on Admission: I have personally reviewed following labs and imaging studies  CBC:  Recent Labs Lab 11/07/15 0820 11/12/15 0603  WBC 15.1* 13.0*  NEUTROABS 13.3*  --   HGB 11.0* 12.2*  HCT 32.6* 35.9*  MCV 95.9 97.0  PLT 210 086    Basic Metabolic Panel:  Recent Labs Lab 11/07/15 0820 11/12/15 0603  NA 136 133*  K 5.3* 5.2*  CL 101 97*  CO2 24 20*  GLUCOSE 201* 106*  BUN 57* 67*  CREATININE 3.81* 4.46*  CALCIUM 8.5* 8.7*    GFR: Estimated Creatinine Clearance: 13.4 mL/min (by C-G formula based on Cr of 4.46).  Urine analysis:    Component Value  Date/Time   COLORURINE YELLOW 10/25/2013 [place] 10/25/2013 1951   LABSPEC 1.016 10/25/2013 1951   PHURINE 5.5 10/25/2013 1951   GLUCOSEU 100* 10/25/2013 1951   HGBUR TRACE* 10/25/2013 [place] NEGATIVE 10/25/2013 [place] NEGATIVE 10/25/2013 1951   PROTEINUR >300* 10/25/2013 1951   UROBILINOGEN 0.2 10/25/2013 1951   NITRITE NEGATIVE 10/25/2013 1951   LEUKOCYTESUR NEGATIVE 10/25/2013 1951    Sepsis Labs: @LABRCNTIP (procalcitonin:4,lacticidven:4) )No results found for this or any previous visit (from the past 240 hour(s)).   Radiological Exams on Admission: Ct Head Wo Contrast  11/12/2015  CLINICAL DATA:  Fall this morning with subsequent confusion. Left posterior neck pain extending  into the left shoulder. Left occipital and vertex headache. EXAM: CT HEAD WITHOUT CONTRAST CT CERVICAL SPINE WITHOUT CONTRAST TECHNIQUE: Multidetector CT imaging of the head and cervical spine was performed following the standard protocol without intravenous contrast. Multiplanar CT image reconstructions of the cervical spine were also generated. COMPARISON:  Multiple exams, including 10/25/2013 FINDINGS: CT HEAD FINDINGS Chronic periventricular white matter, corona radiata, and central pontine hypodensity compatible with chronic ischemic microvascular white matter disease. Otherwise, the brainstem, cerebellum, cerebral peduncles, thalami, basal ganglia, basilar cisterns, and ventricular system appear within normal limits. No intracranial hemorrhage, mass lesion, or acute CVA. Chronic high density in the globes, possibly from Perfluoro-n-Octane or similar injection in the past, correlate with ophthalmic history. There is atherosclerotic calcification of the cavernous carotid arteries bilaterally. CT CERVICAL SPINE FINDINGS Calcification of the transverse ligament of C1. Prominent loss of intervertebral disc height at C5-6, C6-7, and C7-T1 possibly with some acquired interbody fusion  at these levels, especially C5-6. Uncinate spurring likely leads to at least mild foraminal stenosis on the right at C3-4 and on the left at C5-6, and C6-7. No cervical spine fracture or acute subluxation is evident. Bilateral sternoclavicular degenerative arthropathy. Bilateral carotid artery atherosclerotic calcification noted. Emphysema at the lung apices along with some mild biapical scarring. IMPRESSION: 1. No acute intracranial findings are acute cervical spine findings. 2. Cervical spondylosis and degenerative disc disease. 3. Carotid atherosclerotic calcification. 4. Emphysema at the lung apices. 5. Bilateral degenerative sternoclavicular arthropathy. 6. Periventricular white matter and corona radiata hypodensities favor chronic ischemic microvascular white matter disease. Electronically Signed   By: Van Clines M.D.   On: 11/12/2015 08:46   Ct Cervical Spine Wo Contrast  11/12/2015  CLINICAL DATA:  Fall this morning with subsequent confusion. Left posterior neck pain extending into the left shoulder. Left occipital and vertex headache. EXAM: CT HEAD WITHOUT CONTRAST CT CERVICAL SPINE WITHOUT CONTRAST TECHNIQUE: Multidetector CT imaging of the head and cervical spine was performed following the standard protocol without intravenous contrast. Multiplanar CT image reconstructions of the cervical spine were also generated. COMPARISON:  Multiple exams, including 10/25/2013 FINDINGS: CT HEAD FINDINGS Chronic periventricular white matter, corona radiata, and central pontine hypodensity compatible with chronic ischemic microvascular white matter disease. Otherwise, the brainstem, cerebellum, cerebral peduncles, thalami, basal ganglia, basilar cisterns, and ventricular system appear within normal limits. No intracranial hemorrhage, mass lesion, or acute CVA. Chronic high density in the globes, possibly from Perfluoro-n-Octane or similar injection in the past, correlate with ophthalmic history. There is  atherosclerotic calcification of the cavernous carotid arteries bilaterally. CT CERVICAL SPINE FINDINGS Calcification of the transverse ligament of C1. Prominent loss of intervertebral disc height at C5-6, C6-7, and C7-T1 possibly with some acquired interbody fusion at these levels, especially C5-6. Uncinate spurring likely leads to at least mild foraminal stenosis on the right at C3-4 and on the left at C5-6, and C6-7. No cervical spine fracture or acute subluxation is evident. Bilateral sternoclavicular degenerative arthropathy. Bilateral carotid artery atherosclerotic calcification noted. Emphysema at the lung apices along with some mild biapical scarring. IMPRESSION: 1. No acute intracranial findings are acute cervical spine findings. 2. Cervical spondylosis and degenerative disc disease. 3. Carotid atherosclerotic calcification. 4. Emphysema at the lung apices. 5. Bilateral degenerative sternoclavicular arthropathy. 6. Periventricular white matter and corona radiata hypodensities favor chronic ischemic microvascular white matter disease. Electronically Signed   By: Van Clines M.D.   On: 11/12/2015 08:46    EKG: Independently reviewed.  Assessment/Plan Principal Problem:   Malignant hypertension  Active Problems:   Primary cancer of right lower lobe of lung (HCC)   Hypertension   Chronic renal failure   Acute on chronic renal failure (HCC)   Leukocytosis   Diabetes (HCC)  Malignant Hypertension with severe occipital headache and neck pain  In the setting of severe dehydration and renal disease . Patient was compliant with her medications. CT negative for acute findings. BP was  215/11 P106, improved with 1 dose of hydralazine, but increasing again shortly thereafter to 192/102 P 95 , indicative of poorly controlled hypertension. Old records dating back to 2015 showed BP runs in the 765Y-650'P systolic.  EKG NSR with nl QTC 455.   Admit to telemetry  Resume HCTZ with prudent monitoring of  his BP. Will add 0.2 mg clonidine and will start him on Toprol XL 12.5 mg and monitor for improvement.  Check renal ultrasound  2 D echo  Consider to continue workup for secondary hypertension as outpatient COntinue pain control with Tylenol   ESRD, in the setting of poor oral intake, with increasing Cr as follows 2.9 in December 2016, to 3.81 on 11/07/2015, to 4.46 today. Patient was scheduled for AVF by Vasc. Surgery on 5/23 which may need to be moved up if Renal Consult deems patient to be a candidate for HD sooner.   Minimize nephrotoxins and renally dose medications Renal US as above  Gentle  IV fluids at 75 cc/h  Renal to manage his hyponatremia and hyperkalemia  Leukocytosis, likely related to pain, dehydration,  rule out infectious process. Current WBC 13. Afebrile. Deneis cough, dysuria,or new areas of skin lesions Urine and Blood Cultures and will await for results prior to initiating antibiotic therapy  Lactic acid and Procalcitonin  Repeat WBC in AM  Type II Diabetes  Current blood sugar level is 106 due to poor oral intake.  Hgb A1C Hold home oral diabetic medications.  SSI Heart healthy carb modified diet.  History of Lung Cancer, St 1 A  Last CT chest on 06/25/15 showed stable disease. Not on treatment Patient to continue observation as OP by his primary care physician    DVT prophylaxis: Heparin  Code Status:   Full  Family Communication:  Discussed with wife Disposition Plan: Expect patient to be discharged to home Consults called:    Renal  Admission status:Obs Tele   [facility] E, PA-C [facility]   If 7PM-7AM, please contact night-coverage [email or web address] Password [email or web address]  11/12/2015, 10:40 AM

## 2015-11-12 NOTE — ED Notes (Signed)
Patient presents with family stating his BP has been elevated and he is c/o headache.  He took a nap last night from [place] and has been confused ever since he got up.

## 2015-11-12 NOTE — Consult Note (Signed)
[facility] Renal Consultation Note  Requesting MD: [facility] Indication for Consultation:   HPI:  [name] is a 82 y.o. male presenting with headache. Family has noted increased confusion since waking up this morning. Recent history of fall in the shower, seen in ED on 4/26. Was initially noted to be taking Aleve q3hr at initial ED visit, however last taken one week ago and has only been taking Tylenol and Tramadol over the last week. Denies head trauma with fall (note that family reports he hit his head, but he denies). Has noted pain in his head and left shoulder since the fall, unrelieved by Ultram. Reports no urination on 4/29, but urination improved 4/30 and normal today. Workup in ED revealed a normal head CT without signs of intracranial bleeding.  Renal function noted to be worsening with creatinine of 4.46 (3.81 on 4/26) and BUN of 67 (57 on 4/26). Potassium also elevated to 5.2 without EKG changes. Patient started on IV fluids and admitted to [facility].  Also recently seen by Vascular for evaluation of hemodialysis access. Determined to be a good candidate for left brachial cephalic AV fistula.  Scheduled fistula placement 12/04/15.   Prior history of CKD IV, DM, HTN, HLD, Stroke, and Lung Cancer noted. Former smoker, quitting 07/2004 after smoking for 62 years.  CREATININE  Date/Time Value Ref Range Status  06/25/2015 01:47 PM 2.9* 0.7 - 1.3 mg/dL Final  06/26/2014 01:47 PM 2.3* 0.7 - 1.3 mg/dL Final  06/27/2013 01:56 PM 1.9* 0.7 - 1.3 mg/dL Final  06/28/2012 01:58 PM 2.2* 0.7 - 1.3 mg/dL Final   CREATININE, SER  Date/Time Value Ref Range Status  11/12/2015 12:06 PM 4.23* 0.61 - 1.24 mg/dL Final  11/12/2015 06:03 AM 4.46* 0.61 - 1.24 mg/dL Final  11/07/2015 08:20 AM 3.81* 0.61 - 1.24 mg/dL Final  10/25/2013 04:19 PM 2.10* 0.50 - 1.35 mg/dL Final  10/25/2013 03:57 PM 2.12* 0.50 - 1.35 mg/dL Final  06/30/2011 02:42 PM 2.25* 0.50 - 1.35  mg/dL Final    Comment:    Result repeated and verified.  07/01/2010 02:05 PM 1.97* 0.40 - 1.50 mg/dL Final  12/31/2009 02:06 PM 2.06* 0.40 - 1.50 mg/dL Final  07/02/2009 02:23 PM 1.81* 0.40 - 1.50 mg/dL Final  12/27/2008 02:58 PM 1.78* 0.40 - 1.50 mg/dL Final  07/12/2008 02:51 PM 1.48 0.40 - 1.50 mg/dL Final  07/04/2008 01:16 PM 1.63* 0.4 - 1.5 mg/dL Final  01/04/2008 01:18 PM 1.41 0.40 - 1.50 mg/dL Final  08/20/2007 03:23 PM 1.42  Final  07/28/2007 01:44 PM 1.36 0.40 - 1.50 mg/dL Final  06/26/2007 05:00 AM 1.30  Final  06/25/2007 03:55 AM 1.38  Final  06/24/2007 04:15 AM 1.67*  Final  06/23/2007 04:01 AM 1.47  Final  06/18/2007 11:45 AM 1.28  Final  03/22/2007 11:46 AM 1.28  Final     PMHx:   Past Medical History  Diagnosis Date  . Diabetes mellitus   . Hypertension   . Hypercholesteremia   . Stroke ([place])   . PNA (pneumonia)   . Detached retina, right   . Cancer ([place])     lung ca dx'd 2008    Past Surgical History  Procedure Laterality Date  . Appendectomy      Family Hx:  Family History  Problem Relation Age of Onset  . Cancer Mother   . Cancer Brother     Social History:  reports that he quit smoking about 11 years ago. He does not have  any smokeless tobacco history on file. He reports that he does not drink alcohol or use illicit drugs.  Allergies: No Known Allergies  Medications: Prior to Admission medications   Medication Sig Start Date End Date Taking? Authorizing Provider  acetaminophen (TYLENOL) 500 MG tablet Take 1,000 mg by mouth every 8 (eight) hours as needed for moderate pain.   Yes Historical Provider, MD  aspirin 81 MG tablet Take 81 mg by mouth daily.   Yes Historical Provider, MD  B Complex-Folic Acid (SUPER B COMPLEX MAXI) TABS Take 1 tablet by mouth daily.   Yes Historical Provider, MD  hydrochlorothiazide (HYDRODIURIL) 25 MG tablet Take 25 mg by mouth daily. 10/16/15  Yes Historical Provider, MD  insulin detemir (LEVEMIR) 100 UNIT/ML  injection Inject 30 Units into the skin at bedtime. Reported on 07/02/2015   Yes Historical Provider, MD  insulin lispro (HUMALOG) 100 UNIT/ML injection Inject 20 Units into the skin every evening. 20 units in evening only   Yes Historical Provider, MD  traMADol (ULTRAM) 50 MG tablet Take 1 tablet (50 mg total) by mouth every 6 (six) hours as needed. 11/07/15  Yes Daleen Bo, MD    I have reviewed the patient's current medications.  Labs:  Results for orders placed or performed during the hospital encounter of 11/12/15 (from the past 48 hour(s))  Basic metabolic panel     Status: Abnormal   Collection Time: 11/12/15  6:03 AM  Result Value Ref Range   Sodium 133 (L) 135 - 145 mmol/L   Potassium 5.2 (H) 3.5 - 5.1 mmol/L   Chloride 97 (L) 101 - 111 mmol/L   CO2 20 (L) 22 - 32 mmol/L   Glucose, Bld 106 (H) 65 - 99 mg/dL   BUN 67 (H) 6 - 20 mg/dL   Creatinine, Ser 4.46 (H) 0.61 - 1.24 mg/dL   Calcium 8.7 (L) 8.9 - 10.3 mg/dL   GFR calc non Af Amer 11 (L) >60 mL/min   GFR calc Af Amer 13 (L) >60 mL/min    Comment: (NOTE) The eGFR has been calculated using the CKD EPI equation. This calculation has not been validated in all clinical situations. eGFR's persistently <60 mL/min signify possible Chronic Kidney Disease.    Anion gap 16 (H) 5 - 15  CBC     Status: Abnormal   Collection Time: 11/12/15  6:03 AM  Result Value Ref Range   WBC 13.0 (H) 4.0 - 10.5 K/uL   RBC 3.70 (L) 4.22 - 5.81 MIL/uL   Hemoglobin 12.2 (L) 13.0 - 17.0 g/dL   HCT 35.9 (L) 39.0 - 52.0 %   MCV 97.0 78.0 - 100.0 fL   MCH 33.0 26.0 - 34.0 pg   MCHC 34.0 30.0 - 36.0 g/dL   RDW 12.8 11.5 - 15.5 %   Platelets 275 150 - 400 K/uL  I-stat troponin, ED     Status: None   Collection Time: 11/12/15  6:09 AM  Result Value Ref Range   Troponin i, poc 0.02 0.00 - 0.08 ng/mL   Comment 3            Comment: Due to the release kinetics of cTnI, a negative result within the first hours of the onset of symptoms does not  rule out myocardial infarction with certainty. If myocardial infarction is still suspected, repeat the test at appropriate intervals.   Lactic acid, plasma     Status: None   Collection Time: 11/12/15 11:38 AM  Result Value Ref Range  Lactic Acid, Venous 1.1 0.5 - 2.0 mmol/L  CBC with Differential     Status: Abnormal   Collection Time: 11/12/15 12:06 PM  Result Value Ref Range   WBC 11.8 (H) 4.0 - 10.5 K/uL   RBC 3.58 (L) 4.22 - 5.81 MIL/uL   Hemoglobin 11.5 (L) 13.0 - 17.0 g/dL   HCT 34.1 (L) 39.0 - 52.0 %   MCV 95.3 78.0 - 100.0 fL   MCH 32.1 26.0 - 34.0 pg   MCHC 33.7 30.0 - 36.0 g/dL   RDW 12.6 11.5 - 15.5 %   Platelets 241 150 - 400 K/uL   Neutrophils Relative % 83 %   Neutro Abs 9.8 (H) 1.7 - 7.7 K/uL   Lymphocytes Relative 11 %   Lymphs Abs 1.3 0.7 - 4.0 K/uL   Monocytes Relative 5 %   Monocytes Absolute 0.6 0.1 - 1.0 K/uL   Eosinophils Relative 1 %   Eosinophils Absolute 0.1 0.0 - 0.7 K/uL   Basophils Relative 0 %   Basophils Absolute 0.0 0.0 - 0.1 K/uL  Comprehensive metabolic panel     Status: Abnormal   Collection Time: 11/12/15 12:06 PM  Result Value Ref Range   Sodium 133 (L) 135 - 145 mmol/L   Potassium 4.4 3.5 - 5.1 mmol/L   Chloride 98 (L) 101 - 111 mmol/L   CO2 19 (L) 22 - 32 mmol/L   Glucose, Bld 131 (H) 65 - 99 mg/dL   BUN 64 (H) 6 - 20 mg/dL   Creatinine, Ser 4.23 (H) 0.61 - 1.24 mg/dL   Calcium 8.1 (L) 8.9 - 10.3 mg/dL   Total Protein 6.9 6.5 - 8.1 g/dL   Albumin 3.2 (L) 3.5 - 5.0 g/dL   AST 28 15 - 41 U/L   ALT 20 17 - 63 U/L   Alkaline Phosphatase 80 38 - 126 U/L   Total Bilirubin 0.5 0.3 - 1.2 mg/dL   GFR calc non Af Amer 12 (L) >60 mL/min   GFR calc Af Amer 13 (L) >60 mL/min    Comment: (NOTE) The eGFR has been calculated using the CKD EPI equation. This calculation has not been validated in all clinical situations. eGFR's persistently <60 mL/min signify possible Chronic Kidney Disease.    Anion gap 16 (H) 5 - 15  Procalcitonin      Status: None   Collection Time: 11/12/15 12:06 PM  Result Value Ref Range   Procalcitonin 0.23 ng/mL    Comment:        Interpretation: PCT (Procalcitonin) <= 0.5 ng/mL: Systemic infection (sepsis) is not likely. Local bacterial infection is possible. (NOTE)         ICU PCT Algorithm               Non ICU PCT Algorithm    ----------------------------     ------------------------------         PCT < 0.25 ng/mL                 PCT < 0.1 ng/mL     Stopping of antibiotics            Stopping of antibiotics       strongly encouraged.               strongly encouraged.    ----------------------------     ------------------------------       PCT level decrease by               PCT <  0.25 ng/mL       >= 80% from peak PCT       OR PCT 0.25 - 0.5 ng/mL          Stopping of antibiotics                                             encouraged.     Stopping of antibiotics           encouraged.    ----------------------------     ------------------------------       PCT level decrease by              PCT >= 0.25 ng/mL       < 80% from peak PCT        AND PCT >= 0.5 ng/mL            Continuin g antibiotics                                              encouraged.       Continuing antibiotics            encouraged.    ----------------------------     ------------------------------     PCT level increase compared          PCT > 0.5 ng/mL         with peak PCT AND          PCT >= 0.5 ng/mL             Escalation of antibiotics                                          strongly encouraged.      Escalation of antibiotics        strongly encouraged.   Protime-INR     Status: None   Collection Time: 11/12/15 12:06 PM  Result Value Ref Range   Prothrombin Time 14.9 11.6 - 15.2 seconds   INR 1.15 0.00 - 1.49  CBG monitoring, ED     Status: Abnormal   Collection Time: 11/12/15 12:16 PM  Result Value Ref Range   Glucose-Capillary 123 (H) 65 - 99 mg/dL     ROS:  Pertinent items are noted in  HPI.  Physical Exam: Filed Vitals:   11/12/15 1400 11/12/15 1500  BP: 174/94 178/93  Pulse: 93 86  Temp:    Resp: 15      General: 82yo male resting comfortably in no apparent distress HEENT: Dry mucous membranes, normocephalic without signs of trauma Eyes: Pinpoint pupils minimally reactive to light (blind, at baseline) Neck: Supple, Tender Heart: Regular rate and rhythm, pedal pulses palpable Lungs: Clear to auscultation bilaterally, no increased work of breathing Abdomen:  Soft and nondistended, bowel sounds normal Extremities: No edema noted Skin: No rashes noted, some abrasions healing well  Assessment/Plan: 1.Renal- Recent use of Aleve q3hr for shoulder and headache. Creatinine 4.46, improved to 4.23 with fluids. Baseline Creatinine 3.8. Potassium 5.2 initially, improved to 4.4. Na 133. Anion Gap 16, possibly secondary to Uremia. Hemoglobin 11.5. EKG without signs of hyperkalemia. Follow up renal US ordered by  primary team. Monitor renal function. If continues to worsen consider initiating HD during hospitalization, but hopeful with improvement may wait until following fistula placement 5/23. 2. Hypertension/Volume  - BP elevated to 180-210s/90/110s. Hydralazine PRN. Primary team continuing home HCTZ and initiating Clonidine and Toprol. Creatinine Clearance ~15; decreased effectiveness of HCTZ with renal function, consider discontinuing. Follow up Echocardiogram.  3. Anemia  - Hemoglobin 11.5. Continue to monitor. 4. Diabetes- Home medication includes Levemir 30units and Humalog 20 units, both at night. Per primary team. 5. Shoulder Pain- Patient reports pain is significant. Management per primary team.   [facility] 11/12/2015, 3:50 PM   Renal Attending: He is admitted after a recent fall and some confusion for observation.  There is an insignificant change in renal fct.  Changing the diuretic is reasonable. Quantel Mcinturff C

## 2015-11-12 NOTE — ED Provider Notes (Addendum)
CSN: [id number]     Arrival date & time 11/12/15  0537 History   First MD Initiated Contact with Patient 11/12/15 [phone]     Chief Complaint  Patient presents with  . Hypertension  . Headache   HPI Pt fell the other day in the shower.  He was seen in the ED on 4/26.  He had xrays of his shoulder.  Since the fall he has had pain in his shoulder still that is not relieved by the ultram.  He is also having pain in the back of his head.  Family is concerned because he has been confused and slept for a long time last night.  He did not urinate much the other day at all although he did urinate on Sunday.  He has history of kidney disease and is followed by nephrology.  He is not on dialysis. Past Medical History  Diagnosis Date  . Diabetes mellitus   . Hypertension   . Hypercholesteremia   . Stroke ([place])   . PNA (pneumonia)   . Detached retina, right   . Cancer ([place])     lung ca dx'd 2008   Past Surgical History  Procedure Laterality Date  . Appendectomy     Family History  Problem Relation Age of Onset  . Cancer Mother   . Cancer Brother    Social History  Substance Use Topics  . Smoking status: Former Smoker -- 1.00 packs/day for 64 years    Quit date: 07/14/2004  . Smokeless tobacco: None  . Alcohol Use: No    Review of Systems  All other systems reviewed and are negative.     Allergies  Review of patient's allergies indicates no known allergies.  Home Medications   Prior to Admission medications   Medication Sig Start Date End Date Taking? Authorizing Provider  acetaminophen (TYLENOL) 500 MG tablet Take 1,000 mg by mouth every 8 (eight) hours as needed for moderate pain.   Yes Historical Provider, MD  aspirin 81 MG tablet Take 81 mg by mouth daily.   Yes Historical Provider, MD  B Complex-Folic Acid (SUPER B COMPLEX MAXI) TABS Take 1 tablet by mouth daily.   Yes Historical Provider, MD  hydrochlorothiazide (HYDRODIURIL) 25 MG tablet Take 25 mg by mouth daily. 10/16/15   Yes Historical Provider, MD  insulin detemir (LEVEMIR) 100 UNIT/ML injection Inject 30 Units into the skin at bedtime. Reported on 07/02/2015   Yes Historical Provider, MD  insulin lispro (HUMALOG) 100 UNIT/ML injection Inject 20 Units into the skin every evening. 20 units in evening only   Yes Historical Provider, MD  traMADol (ULTRAM) 50 MG tablet Take 1 tablet (50 mg total) by mouth every 6 (six) hours as needed. 11/07/15  Yes Daleen Bo, MD   BP 189/91 mmHg  Pulse 94  Temp(Src) 97.7 F (36.5 C) (Oral)  Resp 17  Ht '5\' 9"'$  (1.753 m)  Wt 88.905 kg  BMI 28.93 kg/m2  SpO2 97% Physical Exam  Constitutional: No distress.  HENT:  Head: Normocephalic and atraumatic.  Right Ear: External ear normal.  Left Ear: External ear normal.  Eyes: Conjunctivae are normal. Right eye exhibits no discharge. Left eye exhibits no discharge. No scleral icterus.  Neck: Neck supple. No tracheal deviation present.  Paraspinal ttp left side  Cardiovascular: Normal rate, regular rhythm and intact distal pulses.   Pulmonary/Chest: Effort normal and breath sounds normal. No stridor. No respiratory distress. He has no wheezes. He has no rales.  Abdominal: Soft.  Bowel sounds are normal. He exhibits no distension. There is no tenderness. There is no rebound and no guarding.  Musculoskeletal: He exhibits no edema or tenderness.       Thoracic back: Normal.       Lumbar back: Normal.  ttp left shoulder, no edema or deformity, rom not limited  Neurological: He is alert. He has normal strength. No cranial nerve deficit (no facial droop, extraocular movements intact, no slurred speech) or sensory deficit. He exhibits normal muscle tone. He displays no seizure activity. Coordination normal.  Skin: Skin is warm and dry. No rash noted. He is not diaphoretic.  Psychiatric: He has a normal mood and affect.  Nursing note and vitals reviewed.   ED Course  Procedures (including critical care time) Labs Review Labs  Reviewed  BASIC METABOLIC PANEL - Abnormal; Notable for the following:    Sodium 133 (*)    Potassium 5.2 (*)    Chloride 97 (*)    CO2 20 (*)    Glucose, Bld 106 (*)    BUN 67 (*)    Creatinine, Ser 4.46 (*)    Calcium 8.7 (*)    GFR calc non Af Amer 11 (*)    GFR calc Af Amer 13 (*)    Anion gap 16 (*)    All other components within normal limits  CBC - Abnormal; Notable for the following:    WBC 13.0 (*)    RBC 3.70 (*)    Hemoglobin 12.2 (*)    HCT 35.9 (*)    All other components within normal limits  I-STAT TROPOININ, ED    Imaging Review Ct Head Wo Contrast  11/12/2015  CLINICAL DATA:  Fall this morning with subsequent confusion. Left posterior neck pain extending into the left shoulder. Left occipital and vertex headache. EXAM: CT HEAD WITHOUT CONTRAST CT CERVICAL SPINE WITHOUT CONTRAST TECHNIQUE: Multidetector CT imaging of the head and cervical spine was performed following the standard protocol without intravenous contrast. Multiplanar CT image reconstructions of the cervical spine were also generated. COMPARISON:  Multiple exams, including 10/25/2013 FINDINGS: CT HEAD FINDINGS Chronic periventricular white matter, corona radiata, and central pontine hypodensity compatible with chronic ischemic microvascular white matter disease. Otherwise, the brainstem, cerebellum, cerebral peduncles, thalami, basal ganglia, basilar cisterns, and ventricular system appear within normal limits. No intracranial hemorrhage, mass lesion, or acute CVA. Chronic high density in the globes, possibly from Perfluoro-n-Octane or similar injection in the past, correlate with ophthalmic history. There is atherosclerotic calcification of the cavernous carotid arteries bilaterally. CT CERVICAL SPINE FINDINGS Calcification of the transverse ligament of C1. Prominent loss of intervertebral disc height at C5-6, C6-7, and C7-T1 possibly with some acquired interbody fusion at these levels, especially C5-6. Uncinate  spurring likely leads to at least mild foraminal stenosis on the right at C3-4 and on the left at C5-6, and C6-7. No cervical spine fracture or acute subluxation is evident. Bilateral sternoclavicular degenerative arthropathy. Bilateral carotid artery atherosclerotic calcification noted. Emphysema at the lung apices along with some mild biapical scarring. IMPRESSION: 1. No acute intracranial findings are acute cervical spine findings. 2. Cervical spondylosis and degenerative disc disease. 3. Carotid atherosclerotic calcification. 4. Emphysema at the lung apices. 5. Bilateral degenerative sternoclavicular arthropathy. 6. Periventricular white matter and corona radiata hypodensities favor chronic ischemic microvascular white matter disease. Electronically Signed   By: Van Clines M.D.   On: 11/12/2015 08:46   Ct Cervical Spine Wo Contrast  11/12/2015  CLINICAL DATA:  Fall this morning  with subsequent confusion. Left posterior neck pain extending into the left shoulder. Left occipital and vertex headache. EXAM: CT HEAD WITHOUT CONTRAST CT CERVICAL SPINE WITHOUT CONTRAST TECHNIQUE: Multidetector CT imaging of the head and cervical spine was performed following the standard protocol without intravenous contrast. Multiplanar CT image reconstructions of the cervical spine were also generated. COMPARISON:  Multiple exams, including 10/25/2013 FINDINGS: CT HEAD FINDINGS Chronic periventricular white matter, corona radiata, and central pontine hypodensity compatible with chronic ischemic microvascular white matter disease. Otherwise, the brainstem, cerebellum, cerebral peduncles, thalami, basal ganglia, basilar cisterns, and ventricular system appear within normal limits. No intracranial hemorrhage, mass lesion, or acute CVA. Chronic high density in the globes, possibly from Perfluoro-n-Octane or similar injection in the past, correlate with ophthalmic history. There is atherosclerotic calcification of the cavernous  carotid arteries bilaterally. CT CERVICAL SPINE FINDINGS Calcification of the transverse ligament of C1. Prominent loss of intervertebral disc height at C5-6, C6-7, and C7-T1 possibly with some acquired interbody fusion at these levels, especially C5-6. Uncinate spurring likely leads to at least mild foraminal stenosis on the right at C3-4 and on the left at C5-6, and C6-7. No cervical spine fracture or acute subluxation is evident. Bilateral sternoclavicular degenerative arthropathy. Bilateral carotid artery atherosclerotic calcification noted. Emphysema at the lung apices along with some mild biapical scarring. IMPRESSION: 1. No acute intracranial findings are acute cervical spine findings. 2. Cervical spondylosis and degenerative disc disease. 3. Carotid atherosclerotic calcification. 4. Emphysema at the lung apices. 5. Bilateral degenerative sternoclavicular arthropathy. 6. Periventricular white matter and corona radiata hypodensities favor chronic ischemic microvascular white matter disease. Electronically Signed   By: Van Clines M.D.   On: 11/12/2015 08:46   I have personally reviewed and evaluated these images and lab results as part of my medical decision-making.   EKG Interpretation   Date/Time:  Monday Nov 12 2015 05:55:51 EDT Ventricular Rate:  94 PR Interval:  194 QRS Duration: 90 QT Interval:  364 QTC Calculation: 455 R Axis:   19 Text Interpretation:  Normal sinus rhythm Minimal voltage criteria for  LVH, may be normal variant Borderline ECG No significant change since last  tracing Confirmed by Kenna Kirn  MD-J, Andrew Blasius ([id number]) on 11/12/2015 7:48:20 AM      MDM   Final diagnoses:  Acute on chronic renal failure (HCC)  Essential hypertension    The patient presented to the ED with confusion and headache associated with a recent fall.    Patient's CT scan does not show any evidence of cervical spine injury or intracranial bleed.  His renal function however is acutely worse.   Creatinine is now elevated at 4.46 and BUN is 67.  5 days ago his creatinine was 3.81 and his BUN was 57.  Bicarb is decreased and his K remains mildly elevated without ecg changes. I'm concerned about this worsening renal function may be contributing to his confusion earlier today.  I started patient on IV fluid hydration. I'll consult the medical service for observation and further monitoring of his renal function.  Dorie Rank, MD 11/12/15 [phone]  Attempted to page on call for nephrology.  Some difficulty getting through to the on call provider.  Message left with the office.  No emergent needs at this time.  Dorie Rank, MD 11/12/15 1054  Discussed with Dr Florene Glen.  Will see the patient in consultation.  Dorie Rank, MD 11/12/15 [phone]

## 2015-11-13 DIAGNOSIS — I10 Essential (primary) hypertension: Secondary | ICD-10-CM

## 2015-11-13 DIAGNOSIS — D72829 Elevated white blood cell count, unspecified: Secondary | ICD-10-CM | POA: Diagnosis not present

## 2015-11-13 DIAGNOSIS — G9340 Encephalopathy, unspecified: Secondary | ICD-10-CM | POA: Diagnosis not present

## 2015-11-13 DIAGNOSIS — N179 Acute kidney failure, unspecified: Secondary | ICD-10-CM | POA: Diagnosis not present

## 2015-11-13 LAB — COMPREHENSIVE METABOLIC PANEL
ALBUMIN: 2.3 g/dL — AB (ref 3.5–5.0)
ALK PHOS: 62 U/L (ref 38–126)
ALT: 16 U/L — AB (ref 17–63)
AST: 20 U/L (ref 15–41)
Anion gap: 10 (ref 5–15)
BILIRUBIN TOTAL: 0.6 mg/dL (ref 0.3–1.2)
BUN: 63 mg/dL — AB (ref 6–20)
CALCIUM: 6.8 mg/dL — AB (ref 8.9–10.3)
CO2: 19 mmol/L — ABNORMAL LOW (ref 22–32)
CREATININE: 3.65 mg/dL — AB (ref 0.61–1.24)
Chloride: 105 mmol/L (ref 101–111)
GFR calc Af Amer: 16 mL/min — ABNORMAL LOW (ref >60)
GFR calc non Af Amer: 14 mL/min — ABNORMAL LOW (ref >60)
GLUCOSE: 136 mg/dL — AB (ref 65–99)
POTASSIUM: 3.9 mmol/L (ref 3.5–5.1)
Sodium: 134 mmol/L — ABNORMAL LOW (ref 135–145)
TOTAL PROTEIN: 5.2 g/dL — AB (ref 6.5–8.1)

## 2015-11-13 LAB — GLUCOSE, CAPILLARY
GLUCOSE-CAPILLARY: 123 mg/dL — AB (ref 65–99)
Glucose-Capillary: 140 mg/dL — ABNORMAL HIGH (ref 65–99)
Glucose-Capillary: 151 mg/dL — ABNORMAL HIGH (ref 65–99)
Glucose-Capillary: 109 mg/dL — ABNORMAL HIGH (ref 65–99)
Glucose-Capillary: 141 mg/dL — ABNORMAL HIGH (ref 65–99)

## 2015-11-13 LAB — URINE CULTURE: Culture: NO GROWTH

## 2015-11-13 LAB — CBC
HEMATOCRIT: 28.6 % — AB (ref 39.0–52.0)
Hemoglobin: 9.6 g/dL — ABNORMAL LOW (ref 13.0–17.0)
MCH: 31.9 pg (ref 26.0–34.0)
MCHC: 33.6 g/dL (ref 30.0–36.0)
MCV: 95 fL (ref 78.0–100.0)
Platelets: 216 10*3/uL (ref 150–400)
RBC: 3.01 MIL/uL — ABNORMAL LOW (ref 4.22–5.81)
RDW: 12.9 % (ref 11.5–15.5)
WBC: 12.7 10*3/uL — ABNORMAL HIGH (ref 4.0–10.5)

## 2015-11-13 LAB — HEMOGLOBIN A1C
Hgb A1c MFr Bld: 8.1 % — ABNORMAL HIGH (ref 4.8–5.6)
Mean Plasma Glucose: 186 mg/dL

## 2015-11-13 MED ORDER — ALUM & MAG HYDROXIDE-SIMETH 200-200-20 MG/5ML PO SUSP
15.0000 mL | ORAL | Status: DC | PRN
  Administered 2015-11-13 (×3): 15 mL via ORAL
  Filled 2015-11-13 (×3): qty 30

## 2015-11-13 NOTE — Progress Notes (Signed)
PROGRESS NOTE    [name]  XVQ:[id number] DOB: [birth year] DOA: 11/12/2015 PCP: Mayra Neer, MD   Outpatient Specialists:    Brief Narrative: 82 y.o. male with medical history significant for HTN, CKD Stage 4, DM insulin dependent, history of St 1 A Lung Cancer not on therapy. Admitted with AKI on CKD, now resolving, with history of NSAIDs use. Accelerated hypertension was noted on admission, as well as confusion. Assessment & Plan:   Principal Problem:   Malignant hypertension Active Problems:   Primary cancer of right lower lobe of lung (HCC)   Hypertension   Chronic renal failure   Acute on chronic renal failure (HCC)   Leukocytosis   Diabetes ([place])    - Malignant Hypertension - Improving. Continue current management. Likely DC in am. - AKI on CKD - AKI has resolved significantly.  - Leukocytosis, likely reactive. Rule out infectious process.   - Type II Diabetes- Optimize.  - History of Lung Cancer, St 1 A Last CT chest on 06/25/15 showed stable disease.    DVT prophylaxis: Heparin  Code Status: Full  Family Communication: Discussed with wife Disposition Plan: Expect patient to be discharged to home Consults called: Renal  Admission status:Obs Tele   Consultants:   Nephrology         Subjective: Slowly improving. BP is improving. No SOB. No fever or chills. AMS is resolving. Likely DC in am.  Objective: Filed Vitals:   11/12/15 2328 11/13/15 0541 11/13/15 0807 11/13/15 1700  BP:  142/67 164/73 168/67  Pulse:  72 80 74  Temp:  98 F (36.7 C) 98.1 F (36.7 C) 97.7 F (36.5 C)  TempSrc:   Oral Oral  Resp:  '18 18 18  '$ Height:      Weight: 82 kg (180 lb 12.4 oz)     SpO2:  96% 96% 94%    Intake/Output Summary (Last 24 hours) at 11/13/15 1803 Last data filed at 11/13/15 1051  Gross per 24 hour  Intake    240 ml  Output   1200 ml  Net   -960 ml   Filed Weights   11/12/15 0542 11/12/15 2328  Weight: 88.905 kg (196 lb) 82 kg (180 lb  12.4 oz)    Examination:  General exam: Appears calm and comfortable. Respiratory system: Clear to auscultation. Respiratory effort normal. Cardiovascular system: S1S2. Gastrointestinal system: Abdomen is nondistended, soft and nontender.   Central nervous system: Alert and oriented. No focal neurological deficits. ?blind Extremities: Symmetric 5 x 5 power.   Data Reviewed: I have personally reviewed following labs and imaging studies  CBC:  Recent Labs Lab 11/07/15 0820 11/12/15 0603 11/12/15 1206 11/13/15 0456  WBC 15.1* 13.0* 11.8* 12.7*  NEUTROABS 13.3*  --  9.8*  --   HGB 11.0* 12.2* 11.5* 9.6*  HCT 32.6* 35.9* 34.1* 28.6*  MCV 95.9 97.0 95.3 95.0  PLT 210 275 241 093   Basic Metabolic Panel:  Recent Labs Lab 11/07/15 0820 11/12/15 0603 11/12/15 1206 11/13/15 0456  NA 136 133* 133* 134*  K 5.3* 5.2* 4.4 3.9  CL 101 97* 98* 105  CO2 24 20* 19* 19*  GLUCOSE 201* 106* 131* 136*  BUN 57* 67* 64* 63*  CREATININE 3.81* 4.46* 4.23* 3.65*  CALCIUM 8.5* 8.7* 8.1* 6.8*   GFR: Estimated Creatinine Clearance: 14.8 mL/min (by C-G formula based on Cr of 3.65). Liver Function Tests:  Recent Labs Lab 11/12/15 1206 11/13/15 0456  AST 28 20  ALT 20 16*  ALKPHOS  80 62  BILITOT 0.5 0.6  PROT 6.9 5.2*  ALBUMIN 3.2* 2.3*   No results for input(s): LIPASE, AMYLASE in the last 168 hours. No results for input(s): AMMONIA in the last 168 hours. Coagulation Profile:  Recent Labs Lab 11/12/15 1206  INR 1.15   Cardiac Enzymes: No results for input(s): CKTOTAL, CKMB, CKMBINDEX, TROPONINI in the last 168 hours. BNP (last 3 results) No results for input(s): PROBNP in the last 8760 hours. HbA1C:  Recent Labs  11/12/15 1207  HGBA1C 8.1*   CBG:  Recent Labs Lab 11/12/15 1738 11/12/15 1943 11/12/15 2305 11/13/15 0751 11/13/15 1148  GLUCAP 193* 175* 140* 123* 151*   Lipid Profile: No results for input(s): CHOL, HDL, LDLCALC, TRIG, CHOLHDL, LDLDIRECT in the  last 72 hours. Thyroid Function Tests: No results for input(s): TSH, T4TOTAL, FREET4, T3FREE, THYROIDAB in the last 72 hours. Anemia Panel: No results for input(s): VITAMINB12, FOLATE, FERRITIN, TIBC, IRON, RETICCTPCT in the last 72 hours. Urine analysis:    Component Value Date/Time   COLORURINE YELLOW 10/25/2013 [place] 10/25/2013 1951   LABSPEC 1.016 10/25/2013 1951   PHURINE 5.5 10/25/2013 1951   GLUCOSEU 100* 10/25/2013 1951   HGBUR TRACE* 10/25/2013 [place] NEGATIVE 10/25/2013 [place] NEGATIVE 10/25/2013 1951   PROTEINUR >300* 10/25/2013 1951   UROBILINOGEN 0.2 10/25/2013 1951   NITRITE NEGATIVE 10/25/2013 1951   LEUKOCYTESUR NEGATIVE 10/25/2013 1951   Sepsis Labs: '@LABRCNTIP'$ (procalcitonin:4,lacticidven:4)  ) Recent Results (from the past 240 hour(s))  Urine culture     Status: None   Collection Time: 11/12/15 10:00 AM  Result Value Ref Range Status   Specimen Description URINE, CLEAN CATCH  Final   Special Requests NONE  Final   Culture NO GROWTH 1 DAY  Final   Report Status 11/13/2015 FINAL  Final  Culture, blood (x 2)     Status: None (Preliminary result)   Collection Time: 11/12/15 11:38 AM  Result Value Ref Range Status   Specimen Description BLOOD LEFT ANTECUBITAL  Final   Special Requests BOTTLES DRAWN AEROBIC AND ANAEROBIC 5CC  Final   Culture NO GROWTH < 24 HOURS  Final   Report Status PENDING  Incomplete  Culture, blood (x 2)     Status: None (Preliminary result)   Collection Time: 11/12/15 11:47 AM  Result Value Ref Range Status   Specimen Description BLOOD LEFT FOREARM  Final   Special Requests BOTTLES DRAWN AEROBIC AND ANAEROBIC 5CC  Final   Culture NO GROWTH < 24 HOURS  Final   Report Status PENDING  Incomplete         Radiology Studies: Ct Head Wo Contrast  11/12/2015  CLINICAL DATA:  Fall this morning with subsequent confusion. Left posterior neck pain extending into the left shoulder. Left occipital and  vertex headache. EXAM: CT HEAD WITHOUT CONTRAST CT CERVICAL SPINE WITHOUT CONTRAST TECHNIQUE: Multidetector CT imaging of the head and cervical spine was performed following the standard protocol without intravenous contrast. Multiplanar CT image reconstructions of the cervical spine were also generated. COMPARISON:  Multiple exams, including 10/25/2013 FINDINGS: CT HEAD FINDINGS Chronic periventricular white matter, corona radiata, and central pontine hypodensity compatible with chronic ischemic microvascular white matter disease. Otherwise, the brainstem, cerebellum, cerebral peduncles, thalami, basal ganglia, basilar cisterns, and ventricular system appear within normal limits. No intracranial hemorrhage, mass lesion, or acute CVA. Chronic high density in the globes, possibly from Perfluoro-n-Octane or similar injection in the past, correlate with ophthalmic history. There is  atherosclerotic calcification of the cavernous carotid arteries bilaterally. CT CERVICAL SPINE FINDINGS Calcification of the transverse ligament of C1. Prominent loss of intervertebral disc height at C5-6, C6-7, and C7-T1 possibly with some acquired interbody fusion at these levels, especially C5-6. Uncinate spurring likely leads to at least mild foraminal stenosis on the right at C3-4 and on the left at C5-6, and C6-7. No cervical spine fracture or acute subluxation is evident. Bilateral sternoclavicular degenerative arthropathy. Bilateral carotid artery atherosclerotic calcification noted. Emphysema at the lung apices along with some mild biapical scarring. IMPRESSION: 1. No acute intracranial findings are acute cervical spine findings. 2. Cervical spondylosis and degenerative disc disease. 3. Carotid atherosclerotic calcification. 4. Emphysema at the lung apices. 5. Bilateral degenerative sternoclavicular arthropathy. 6. Periventricular white matter and corona radiata hypodensities favor chronic ischemic microvascular white matter  disease. Electronically Signed   By: Van Clines M.D.   On: 11/12/2015 08:46   Ct Cervical Spine Wo Contrast  11/12/2015  CLINICAL DATA:  Fall this morning with subsequent confusion. Left posterior neck pain extending into the left shoulder. Left occipital and vertex headache. EXAM: CT HEAD WITHOUT CONTRAST CT CERVICAL SPINE WITHOUT CONTRAST TECHNIQUE: Multidetector CT imaging of the head and cervical spine was performed following the standard protocol without intravenous contrast. Multiplanar CT image reconstructions of the cervical spine were also generated. COMPARISON:  Multiple exams, including 10/25/2013 FINDINGS: CT HEAD FINDINGS Chronic periventricular white matter, corona radiata, and central pontine hypodensity compatible with chronic ischemic microvascular white matter disease. Otherwise, the brainstem, cerebellum, cerebral peduncles, thalami, basal ganglia, basilar cisterns, and ventricular system appear within normal limits. No intracranial hemorrhage, mass lesion, or acute CVA. Chronic high density in the globes, possibly from Perfluoro-n-Octane or similar injection in the past, correlate with ophthalmic history. There is atherosclerotic calcification of the cavernous carotid arteries bilaterally. CT CERVICAL SPINE FINDINGS Calcification of the transverse ligament of C1. Prominent loss of intervertebral disc height at C5-6, C6-7, and C7-T1 possibly with some acquired interbody fusion at these levels, especially C5-6. Uncinate spurring likely leads to at least mild foraminal stenosis on the right at C3-4 and on the left at C5-6, and C6-7. No cervical spine fracture or acute subluxation is evident. Bilateral sternoclavicular degenerative arthropathy. Bilateral carotid artery atherosclerotic calcification noted. Emphysema at the lung apices along with some mild biapical scarring. IMPRESSION: 1. No acute intracranial findings are acute cervical spine findings. 2. Cervical spondylosis and  degenerative disc disease. 3. Carotid atherosclerotic calcification. 4. Emphysema at the lung apices. 5. Bilateral degenerative sternoclavicular arthropathy. 6. Periventricular white matter and corona radiata hypodensities favor chronic ischemic microvascular white matter disease. Electronically Signed   By: Van Clines M.D.   On: 11/12/2015 08:46        Scheduled Meds: . aspirin EC  81 mg Oral Daily  . heparin  5,000 Units Subcutaneous Q8H  . insulin aspart  0-9 Units Subcutaneous TID WC  . metoprolol succinate  12.5 mg Oral Daily  . sodium chloride flush  3 mL Intravenous Q12H   Continuous Infusions: . sodium chloride 75 mL/hr at 11/13/15 1248        Time spent: 30 Mins    Bonnell Public, MD [facility] Pager [phone]  If 7PM-7AM, please contact night-coverage [email or web address] Password [facility] 11/13/2015, 6:03 PM

## 2015-11-13 NOTE — Consult Note (Signed)
[facility] Renal Consultation Note  Requesting MD: [facility] Indication for Consultation:   HPI:  [name] is a 82 y.o. male presenting with headache following a fall one week ago and noted to have increased Creatinine. Reports he still notes some shoulder and neck pain. Also reports acid reflux. No other acute concerns at this time.  Also recently seen by Vascular for evaluation of hemodialysis access. Determined to be a good candidate for left brachial cephalic AV fistula.  Scheduled fistula placement 12/04/15.   Prior history of CKD IV, DM, HTN, HLD, Stroke, and Lung Cancer noted. Former smoker, quitting 07/2004 after smoking for 62 years.  CREATININE  Date/Time Value Ref Range Status  06/25/2015 01:47 PM 2.9* 0.7 - 1.3 mg/dL Final  06/26/2014 01:47 PM 2.3* 0.7 - 1.3 mg/dL Final  06/27/2013 01:56 PM 1.9* 0.7 - 1.3 mg/dL Final  06/28/2012 01:58 PM 2.2* 0.7 - 1.3 mg/dL Final   CREATININE, SER  Date/Time Value Ref Range Status  11/13/2015 04:56 AM 3.65* 0.61 - 1.24 mg/dL Final  11/12/2015 12:06 PM 4.23* 0.61 - 1.24 mg/dL Final  11/12/2015 06:03 AM 4.46* 0.61 - 1.24 mg/dL Final  11/07/2015 08:20 AM 3.81* 0.61 - 1.24 mg/dL Final  10/25/2013 04:19 PM 2.10* 0.50 - 1.35 mg/dL Final  10/25/2013 03:57 PM 2.12* 0.50 - 1.35 mg/dL Final  06/30/2011 02:42 PM 2.25* 0.50 - 1.35 mg/dL Final    Comment:    Result repeated and verified.  07/01/2010 02:05 PM 1.97* 0.40 - 1.50 mg/dL Final  12/31/2009 02:06 PM 2.06* 0.40 - 1.50 mg/dL Final  07/02/2009 02:23 PM 1.81* 0.40 - 1.50 mg/dL Final  12/27/2008 02:58 PM 1.78* 0.40 - 1.50 mg/dL Final  07/12/2008 02:51 PM 1.48 0.40 - 1.50 mg/dL Final  07/04/2008 01:16 PM 1.63* 0.4 - 1.5 mg/dL Final  01/04/2008 01:18 PM 1.41 0.40 - 1.50 mg/dL Final  08/20/2007 03:23 PM 1.42  Final  07/28/2007 01:44 PM 1.36 0.40 - 1.50 mg/dL Final  06/26/2007 05:00 AM 1.30  Final  06/25/2007 03:55 AM 1.38  Final  06/24/2007 04:15 AM 1.67*   Final  06/23/2007 04:01 AM 1.47  Final  06/18/2007 11:45 AM 1.28  Final  03/22/2007 11:46 AM 1.28  Final     PMHx:   Past Medical History  Diagnosis Date  . Diabetes mellitus   . Hypertension   . Hypercholesteremia   . Stroke ([place])   . PNA (pneumonia)   . Detached retina, right   . Cancer ([place])     lung ca dx'd 2008    Past Surgical History  Procedure Laterality Date  . Appendectomy      Family Hx:  Family History  Problem Relation Age of Onset  . Cancer Mother   . Cancer Brother     Social History:  reports that he quit smoking about 11 years ago. He does not have any smokeless tobacco history on file. He reports that he does not drink alcohol or use illicit drugs.  Allergies: No Known Allergies  Medications: Prior to Admission medications   Medication Sig Start Date End Date Taking? Authorizing Provider  acetaminophen (TYLENOL) 500 MG tablet Take 1,000 mg by mouth every 8 (eight) hours as needed for moderate pain.   Yes Historical Provider, MD  aspirin 81 MG tablet Take 81 mg by mouth daily.   Yes Historical Provider, MD  B Complex-Folic Acid (SUPER B COMPLEX MAXI) TABS Take 1 tablet by mouth daily.   Yes Historical Provider, MD  hydrochlorothiazide (  HYDRODIURIL) 25 MG tablet Take 25 mg by mouth daily. 10/16/15  Yes Historical Provider, MD  insulin detemir (LEVEMIR) 100 UNIT/ML injection Inject 30 Units into the skin at bedtime. Reported on 07/02/2015   Yes Historical Provider, MD  insulin lispro (HUMALOG) 100 UNIT/ML injection Inject 20 Units into the skin every evening. 20 units in evening only   Yes Historical Provider, MD  traMADol (ULTRAM) 50 MG tablet Take 1 tablet (50 mg total) by mouth every 6 (six) hours as needed. 11/07/15  Yes Daleen Bo, MD    I have reviewed the patient's current medications.  Labs:  Results for orders placed or performed during the hospital encounter of 11/12/15 (from the past 48 hour(s))  Basic metabolic panel     Status: Abnormal    Collection Time: 11/12/15  6:03 AM  Result Value Ref Range   Sodium 133 (L) 135 - 145 mmol/L   Potassium 5.2 (H) 3.5 - 5.1 mmol/L   Chloride 97 (L) 101 - 111 mmol/L   CO2 20 (L) 22 - 32 mmol/L   Glucose, Bld 106 (H) 65 - 99 mg/dL   BUN 67 (H) 6 - 20 mg/dL   Creatinine, Ser 4.46 (H) 0.61 - 1.24 mg/dL   Calcium 8.7 (L) 8.9 - 10.3 mg/dL   GFR calc non Af Amer 11 (L) >60 mL/min   GFR calc Af Amer 13 (L) >60 mL/min    Comment: (NOTE) The eGFR has been calculated using the CKD EPI equation. This calculation has not been validated in all clinical situations. eGFR's persistently <60 mL/min signify possible Chronic Kidney Disease.    Anion gap 16 (H) 5 - 15  CBC     Status: Abnormal   Collection Time: 11/12/15  6:03 AM  Result Value Ref Range   WBC 13.0 (H) 4.0 - 10.5 K/uL   RBC 3.70 (L) 4.22 - 5.81 MIL/uL   Hemoglobin 12.2 (L) 13.0 - 17.0 g/dL   HCT 35.9 (L) 39.0 - 52.0 %   MCV 97.0 78.0 - 100.0 fL   MCH 33.0 26.0 - 34.0 pg   MCHC 34.0 30.0 - 36.0 g/dL   RDW 12.8 11.5 - 15.5 %   Platelets 275 150 - 400 K/uL  I-stat troponin, ED     Status: None   Collection Time: 11/12/15  6:09 AM  Result Value Ref Range   Troponin i, poc 0.02 0.00 - 0.08 ng/mL   Comment 3            Comment: Due to the release kinetics of cTnI, a negative result within the first hours of the onset of symptoms does not rule out myocardial infarction with certainty. If myocardial infarction is still suspected, repeat the test at appropriate intervals.   Lactic acid, plasma     Status: None   Collection Time: 11/12/15 11:38 AM  Result Value Ref Range   Lactic Acid, Venous 1.1 0.5 - 2.0 mmol/L  CBC with Differential     Status: Abnormal   Collection Time: 11/12/15 12:06 PM  Result Value Ref Range   WBC 11.8 (H) 4.0 - 10.5 K/uL   RBC 3.58 (L) 4.22 - 5.81 MIL/uL   Hemoglobin 11.5 (L) 13.0 - 17.0 g/dL   HCT 34.1 (L) 39.0 - 52.0 %   MCV 95.3 78.0 - 100.0 fL   MCH 32.1 26.0 - 34.0 pg   MCHC 33.7 30.0 - 36.0  g/dL   RDW 12.6 11.5 - 15.5 %   Platelets 241 150 -  400 K/uL   Neutrophils Relative % 83 %   Neutro Abs 9.8 (H) 1.7 - 7.7 K/uL   Lymphocytes Relative 11 %   Lymphs Abs 1.3 0.7 - 4.0 K/uL   Monocytes Relative 5 %   Monocytes Absolute 0.6 0.1 - 1.0 K/uL   Eosinophils Relative 1 %   Eosinophils Absolute 0.1 0.0 - 0.7 K/uL   Basophils Relative 0 %   Basophils Absolute 0.0 0.0 - 0.1 K/uL  Comprehensive metabolic panel     Status: Abnormal   Collection Time: 11/12/15 12:06 PM  Result Value Ref Range   Sodium 133 (L) 135 - 145 mmol/L   Potassium 4.4 3.5 - 5.1 mmol/L   Chloride 98 (L) 101 - 111 mmol/L   CO2 19 (L) 22 - 32 mmol/L   Glucose, Bld 131 (H) 65 - 99 mg/dL   BUN 64 (H) 6 - 20 mg/dL   Creatinine, Ser 4.23 (H) 0.61 - 1.24 mg/dL   Calcium 8.1 (L) 8.9 - 10.3 mg/dL   Total Protein 6.9 6.5 - 8.1 g/dL   Albumin 3.2 (L) 3.5 - 5.0 g/dL   AST 28 15 - 41 U/L   ALT 20 17 - 63 U/L   Alkaline Phosphatase 80 38 - 126 U/L   Total Bilirubin 0.5 0.3 - 1.2 mg/dL   GFR calc non Af Amer 12 (L) >60 mL/min   GFR calc Af Amer 13 (L) >60 mL/min    Comment: (NOTE) The eGFR has been calculated using the CKD EPI equation. This calculation has not been validated in all clinical situations. eGFR's persistently <60 mL/min signify possible Chronic Kidney Disease.    Anion gap 16 (H) 5 - 15  Procalcitonin     Status: None   Collection Time: 11/12/15 12:06 PM  Result Value Ref Range   Procalcitonin 0.23 ng/mL    Comment:        Interpretation: PCT (Procalcitonin) <= 0.5 ng/mL: Systemic infection (sepsis) is not likely. Local bacterial infection is possible. (NOTE)         ICU PCT Algorithm               Non ICU PCT Algorithm    ----------------------------     ------------------------------         PCT < 0.25 ng/mL                 PCT < 0.1 ng/mL     Stopping of antibiotics            Stopping of antibiotics       strongly encouraged.               strongly encouraged.     ----------------------------     ------------------------------       PCT level decrease by               PCT < 0.25 ng/mL       >= 80% from peak PCT       OR PCT 0.25 - 0.5 ng/mL          Stopping of antibiotics                                             encouraged.     Stopping of antibiotics           encouraged.    ----------------------------     ------------------------------  PCT level decrease by              PCT >= 0.25 ng/mL       < 80% from peak PCT        AND PCT >= 0.5 ng/mL            Continuin g antibiotics                                              encouraged.       Continuing antibiotics            encouraged.    ----------------------------     ------------------------------     PCT level increase compared          PCT > 0.5 ng/mL         with peak PCT AND          PCT >= 0.5 ng/mL             Escalation of antibiotics                                          strongly encouraged.      Escalation of antibiotics        strongly encouraged.   Protime-INR     Status: None   Collection Time: 11/12/15 12:06 PM  Result Value Ref Range   Prothrombin Time 14.9 11.6 - 15.2 seconds   INR 1.15 0.00 - 1.49  CBG monitoring, ED     Status: Abnormal   Collection Time: 11/12/15 12:16 PM  Result Value Ref Range   Glucose-Capillary 123 (H) 65 - 99 mg/dL  Lactic acid, plasma     Status: None   Collection Time: 11/12/15  2:57 PM  Result Value Ref Range   Lactic Acid, Venous 0.9 0.5 - 2.0 mmol/L  CBG monitoring, ED     Status: Abnormal   Collection Time: 11/12/15  5:38 PM  Result Value Ref Range   Glucose-Capillary 193 (H) 65 - 99 mg/dL  Glucose, capillary     Status: Abnormal   Collection Time: 11/12/15  7:43 PM  Result Value Ref Range   Glucose-Capillary 175 (H) 65 - 99 mg/dL  Glucose, capillary     Status: Abnormal   Collection Time: 11/12/15 11:05 PM  Result Value Ref Range   Glucose-Capillary 140 (H) 65 - 99 mg/dL  Comprehensive metabolic panel     Status:  Abnormal   Collection Time: 11/13/15  4:56 AM  Result Value Ref Range   Sodium 134 (L) 135 - 145 mmol/L   Potassium 3.9 3.5 - 5.1 mmol/L   Chloride 105 101 - 111 mmol/L   CO2 19 (L) 22 - 32 mmol/L   Glucose, Bld 136 (H) 65 - 99 mg/dL   BUN 63 (H) 6 - 20 mg/dL   Creatinine, Ser 3.65 (H) 0.61 - 1.24 mg/dL   Calcium 6.8 (L) 8.9 - 10.3 mg/dL   Total Protein 5.2 (L) 6.5 - 8.1 g/dL   Albumin 2.3 (L) 3.5 - 5.0 g/dL   AST 20 15 - 41 U/L   ALT 16 (L) 17 - 63 U/L   Alkaline Phosphatase 62 38 - 126 U/L   Total Bilirubin 0.6 0.3 - 1.2 mg/dL  GFR calc non Af Amer 14 (L) >60 mL/min   GFR calc Af Amer 16 (L) >60 mL/min    Comment: (NOTE) The eGFR has been calculated using the CKD EPI equation. This calculation has not been validated in all clinical situations. eGFR's persistently <60 mL/min signify possible Chronic Kidney Disease.    Anion gap 10 5 - 15  CBC     Status: Abnormal   Collection Time: 11/13/15  4:56 AM  Result Value Ref Range   WBC 12.7 (H) 4.0 - 10.5 K/uL   RBC 3.01 (L) 4.22 - 5.81 MIL/uL   Hemoglobin 9.6 (L) 13.0 - 17.0 g/dL   HCT 28.6 (L) 39.0 - 52.0 %   MCV 95.0 78.0 - 100.0 fL   MCH 31.9 26.0 - 34.0 pg   MCHC 33.6 30.0 - 36.0 g/dL   RDW 12.9 11.5 - 15.5 %   Platelets 216 150 - 400 K/uL     ROS:  Pertinent items are noted in HPI.  Physical Exam: Filed Vitals:   11/12/15 2052 11/13/15 0541  BP: 166/84 142/67  Pulse: 80 72  Temp: 97.6 F (36.4 C) 98 F (36.7 C)  Resp: 17 18     General: 82yo male resting comfortably in no apparent distress HEENT: Dry mucous membranes, normocephalic without signs of trauma Eyes: Pinpoint pupils minimally reactive to light (blind, at baseline) Neck: Supple, Tender Heart: Regular rate and rhythm, pedal pulses palpable Lungs: Clear to auscultation bilaterally, no increased work of breathing Abdomen:  Soft and nondistended, bowel sounds normal Extremities: No edema noted Skin: No rashes noted, some abrasions healing  well  Assessment/Plan: 1.Renal- Initial Creatinine 4.46, improved to 3.65 with fluids. Baseline Creatinine 3.8. Follow up renal US ordered by primary team. Monitor renal function. Encouraged to follow up for fistula placement as scheduled on 5/23. Stable for discharge from Nephrology standpoint. 2. Hypertension/Volume  - BP elevated to 180-210s/90/110s, improved to 142/67 this morning. Continue Metoprolol and Hydralazine PRN. Follow up Echocardiogram.  3. Anemia  - Hemoglobin 11.5>9.6. Continue to monitor. Recommend checking for stabilization of hemoglobin prior to discharge. 4. Diabetes- Home medication includes Levemir 30units and Humalog 20 units, both at night. Per primary team. 5. Shoulder Pain- Patient reports pain is significant. Management per primary team.   [facility] 11/13/2015, 7:37 AM   Renal Attending: Renal fct at baseline. Agree with note articulated above.   Dameka Younker C

## 2015-11-14 ENCOUNTER — Observation Stay (HOSPITAL_BASED_OUTPATIENT_CLINIC_OR_DEPARTMENT_OTHER): Payer: Medicare Other

## 2015-11-14 ENCOUNTER — Other Ambulatory Visit: Payer: Self-pay

## 2015-11-14 DIAGNOSIS — D72829 Elevated white blood cell count, unspecified: Secondary | ICD-10-CM

## 2015-11-14 DIAGNOSIS — R011 Cardiac murmur, unspecified: Secondary | ICD-10-CM | POA: Diagnosis not present

## 2015-11-14 DIAGNOSIS — I10 Essential (primary) hypertension: Secondary | ICD-10-CM | POA: Diagnosis not present

## 2015-11-14 DIAGNOSIS — G9340 Encephalopathy, unspecified: Secondary | ICD-10-CM

## 2015-11-14 DIAGNOSIS — N179 Acute kidney failure, unspecified: Secondary | ICD-10-CM

## 2015-11-14 DIAGNOSIS — N189 Chronic kidney disease, unspecified: Secondary | ICD-10-CM

## 2015-11-14 LAB — ECHOCARDIOGRAM COMPLETE
HEIGHTINCHES: 69 in
WEIGHTICAEL: 2892.44 [oz_av]

## 2015-11-14 LAB — GLUCOSE, CAPILLARY
GLUCOSE-CAPILLARY: 141 mg/dL — AB (ref 65–99)
GLUCOSE-CAPILLARY: 136 mg/dL — AB (ref 65–99)

## 2015-11-14 MED ORDER — CARVEDILOL 3.125 MG PO TABS: 3.1250 mg | ORAL_TABLET | Freq: Two times a day (BID) | ORAL | Status: AC

## 2015-11-14 MED ORDER — AMLODIPINE BESYLATE 5 MG PO TABS: 5.0000 mg | ORAL_TABLET | Freq: Every day | ORAL | Status: AC

## 2015-11-14 NOTE — Progress Notes (Signed)
  Echocardiogram 2D Echocardiogram has been performed.  [name] 11/14/2015, 1:58 PM

## 2015-11-14 NOTE — Progress Notes (Signed)
Patient is a high fall risk. Pt's son at the bedside and requested for bed alarm to be turned off. Patient's son verbalized that he is the one that gets him up to use the bathroom. Patient also doesn't have yellow nonskid socks on and refused to put them on. RN educated patient and son on patient's safety and both verbalized understanding. RN will continue to monitor patient.   Ermalinda Memos, RN

## 2015-11-14 NOTE — Discharge Summary (Signed)
Physician Discharge Summary  Patient ID: [name] MRN: [id number] DOB/AGE: 1927/08/16 82 y.o.  Admit date: 11/12/2015 Discharge date: 11/14/2015  Admission Diagnoses:  Discharge Diagnoses:  Principal Problem:   Malignant hypertension Active Problems:   Primary cancer of right lower lobe of lung ([place])   Hypertension   Chronic renal failure   Acute on chronic renal failure (HCC)   Leukocytosis   Diabetes ([place])   Discharged Condition: stable  Hospital Course: 82 y.o. male with medical history significant for HTN, CKD Stage 4, DM insulin dependent, history of St 1 A Lung Cancer not on therapy. Admitted with AKI on CKD, now resolving, with history of NSAIDs use. Accelerated hypertension was noted on admission, as well as confusion. The BP has improved significantly. Patient will be discharged back home on coreg and Norvasc. BP needs to be followed up closely and medications titrated as deemed necessary. HCTZ will be held considering advanced renal impairment. Trial of lasix may be considered after AKI resolves, as this may help with BP control. Acute confusion has resolved. Patient has been cleared for discharge by the Nephrology team as well. Patient will need to follow up with the PCP and the Nephrologist on discharge..  Consults: Nephrology  Discharge Medication - See Med. Rec. Treatments:  Discharge Exam: Blood pressure 170/72, pulse 79, temperature 98 F (36.7 C), temperature source Oral, resp. rate 187, height '5\' 9"'$  (1.753 m), weight 82 kg (180 lb 12.4 oz), SpO2 96 %.    Disposition: 01-Home or Self Care  Discharge Instructions    Call MD for:    Complete by:  As directed   Worsening symptoms     Diet - low sodium heart healthy    Complete by:  As directed   Renal diet     Discharge instructions    Complete by:  As directed   Check BP twice daily and report to PCP and Nephrologist     Increase activity slowly    Complete by:  As directed              Medication List    STOP taking these medications        hydrochlorothiazide 25 MG tablet  Commonly known as:  HYDRODIURIL     traMADol 50 MG tablet  Commonly known as:  ULTRAM      TAKE these medications        acetaminophen 500 MG tablet  Commonly known as:  TYLENOL  Take 1,000 mg by mouth every 8 (eight) hours as needed for moderate pain.     amLODipine 5 MG tablet  Commonly known as:  NORVASC  Take 1 tablet (5 mg total) by mouth daily.     aspirin 81 MG tablet  Take 81 mg by mouth daily.     carvedilol 3.125 MG tablet  Commonly known as:  COREG  Take 1 tablet (3.125 mg total) by mouth 2 (two) times daily with a meal.     insulin detemir 100 UNIT/ML injection  Commonly known as:  LEVEMIR  Inject 30 Units into the skin at bedtime. Reported on 07/02/2015     insulin lispro 100 UNIT/ML injection  Commonly known as:  HUMALOG  Inject 20 Units into the skin every evening. 20 units in evening only     SUPER B COMPLEX MAXI Tabs  Take 1 tablet by mouth daily.           Follow-up Information    Follow up with Mayra Neer, MD.  Specialty:  Family Medicine   Contact information:   [address] [place] [address] [phone]       Signed: Bonnell Public 11/14/2015, 1:05 PM

## 2015-11-14 NOTE — Consult Note (Signed)
Subjective:  Doing well this morning. Asking to go home later today.  Objective Vital signs in last 24 hours: Filed Vitals:   11/13/15 0807 11/13/15 1700 11/13/15 2135 11/14/15 0510  BP: 164/73 168/67 166/77 160/71  Pulse: 80 74 70 73  Temp: 98.1 F (36.7 C) 97.7 F (36.5 C) 98.3 F (36.8 C) 97.9 F (36.6 C)  TempSrc: Oral Oral Oral Oral  Resp: '18 18 18 18  '$ Height:      Weight:      SpO2: 96% 94% 96% 98%   Weight change:   Intake/Output Summary (Last 24 hours) at 11/14/15 0747 Last data filed at 11/14/15 0600  Gross per 24 hour  Intake    360 ml  Output    350 ml  Net     10 ml   Physical Exam: General: 82yo male resting comfortably in no apparent distress Heart: RRR Lungs: NWOB, CTA Abdomen: S, NT, ND Extremities: No edema noted Skin: No rashes noted, some abrasions healing well  Labs: Basic Metabolic Panel:  Recent Labs Lab 11/12/15 0603 11/12/15 1206 11/13/15 0456  NA 133* 133* 134*  K 5.2* 4.4 3.9  CL 97* 98* 105  CO2 20* 19* 19*  GLUCOSE 106* 131* 136*  BUN 67* 64* 63*  CREATININE 4.46* 4.23* 3.65*  CALCIUM 8.7* 8.1* 6.8*   Liver Function Tests:  Recent Labs Lab 11/12/15 1206 11/13/15 0456  AST 28 20  ALT 20 16*  ALKPHOS 80 62  BILITOT 0.5 0.6  PROT 6.9 5.2*  ALBUMIN 3.2* 2.3*   No results for input(s): LIPASE, AMYLASE in the last 168 hours. No results for input(s): AMMONIA in the last 168 hours. CBC:  Recent Labs Lab 11/07/15 0820 11/12/15 0603 11/12/15 1206 11/13/15 0456  WBC 15.1* 13.0* 11.8* 12.7*  NEUTROABS 13.3*  --  9.8*  --   HGB 11.0* 12.2* 11.5* 9.6*  HCT 32.6* 35.9* 34.1* 28.6*  MCV 95.9 97.0 95.3 95.0  PLT 210 275 241 216   Cardiac Enzymes: No results for input(s): CKTOTAL, CKMB, CKMBINDEX, TROPONINI in the last 168 hours. CBG:  Recent Labs Lab 11/12/15 2305 11/13/15 0751 11/13/15 1148 11/13/15 1706 11/13/15 2134  GLUCAP 140* 123* 151* 109* 141*    Iron Studies: No results for input(s): IRON, TIBC,  TRANSFERRIN, FERRITIN in the last 72 hours. Studies/Results: Ct Head Wo Contrast  11/12/2015  CLINICAL DATA:  Fall this morning with subsequent confusion. Left posterior neck pain extending into the left shoulder. Left occipital and vertex headache. EXAM: CT HEAD WITHOUT CONTRAST CT CERVICAL SPINE WITHOUT CONTRAST TECHNIQUE: Multidetector CT imaging of the head and cervical spine was performed following the standard protocol without intravenous contrast. Multiplanar CT image reconstructions of the cervical spine were also generated. COMPARISON:  Multiple exams, including 10/25/2013 FINDINGS: CT HEAD FINDINGS Chronic periventricular white matter, corona radiata, and central pontine hypodensity compatible with chronic ischemic microvascular white matter disease. Otherwise, the brainstem, cerebellum, cerebral peduncles, thalami, basal ganglia, basilar cisterns, and ventricular system appear within normal limits. No intracranial hemorrhage, mass lesion, or acute CVA. Chronic high density in the globes, possibly from Perfluoro-n-Octane or similar injection in the past, correlate with ophthalmic history. There is atherosclerotic calcification of the cavernous carotid arteries bilaterally. CT CERVICAL SPINE FINDINGS Calcification of the transverse ligament of C1. Prominent loss of intervertebral disc height at C5-6, C6-7, and C7-T1 possibly with some acquired interbody fusion at these levels, especially C5-6. Uncinate spurring likely leads to at least mild foraminal stenosis on the right at  C3-4 and on the left at C5-6, and C6-7. No cervical spine fracture or acute subluxation is evident. Bilateral sternoclavicular degenerative arthropathy. Bilateral carotid artery atherosclerotic calcification noted. Emphysema at the lung apices along with some mild biapical scarring. IMPRESSION: 1. No acute intracranial findings are acute cervical spine findings. 2. Cervical spondylosis and degenerative disc disease. 3. Carotid  atherosclerotic calcification. 4. Emphysema at the lung apices. 5. Bilateral degenerative sternoclavicular arthropathy. 6. Periventricular white matter and corona radiata hypodensities favor chronic ischemic microvascular white matter disease. Electronically Signed   By: Van Clines M.D.   On: 11/12/2015 08:46   Ct Cervical Spine Wo Contrast  11/12/2015  CLINICAL DATA:  Fall this morning with subsequent confusion. Left posterior neck pain extending into the left shoulder. Left occipital and vertex headache. EXAM: CT HEAD WITHOUT CONTRAST CT CERVICAL SPINE WITHOUT CONTRAST TECHNIQUE: Multidetector CT imaging of the head and cervical spine was performed following the standard protocol without intravenous contrast. Multiplanar CT image reconstructions of the cervical spine were also generated. COMPARISON:  Multiple exams, including 10/25/2013 FINDINGS: CT HEAD FINDINGS Chronic periventricular white matter, corona radiata, and central pontine hypodensity compatible with chronic ischemic microvascular white matter disease. Otherwise, the brainstem, cerebellum, cerebral peduncles, thalami, basal ganglia, basilar cisterns, and ventricular system appear within normal limits. No intracranial hemorrhage, mass lesion, or acute CVA. Chronic high density in the globes, possibly from Perfluoro-n-Octane or similar injection in the past, correlate with ophthalmic history. There is atherosclerotic calcification of the cavernous carotid arteries bilaterally. CT CERVICAL SPINE FINDINGS Calcification of the transverse ligament of C1. Prominent loss of intervertebral disc height at C5-6, C6-7, and C7-T1 possibly with some acquired interbody fusion at these levels, especially C5-6. Uncinate spurring likely leads to at least mild foraminal stenosis on the right at C3-4 and on the left at C5-6, and C6-7. No cervical spine fracture or acute subluxation is evident. Bilateral sternoclavicular degenerative arthropathy. Bilateral  carotid artery atherosclerotic calcification noted. Emphysema at the lung apices along with some mild biapical scarring. IMPRESSION: 1. No acute intracranial findings are acute cervical spine findings. 2. Cervical spondylosis and degenerative disc disease. 3. Carotid atherosclerotic calcification. 4. Emphysema at the lung apices. 5. Bilateral degenerative sternoclavicular arthropathy. 6. Periventricular white matter and corona radiata hypodensities favor chronic ischemic microvascular white matter disease. Electronically Signed   By: Van Clines M.D.   On: 11/12/2015 08:46   Medications: Infusions: . sodium chloride 75 mL/hr at 11/14/15 0030    Scheduled Medications: . aspirin EC  81 mg Oral Daily  . heparin  5,000 Units Subcutaneous Q8H  . insulin aspart  0-9 Units Subcutaneous TID WC  . metoprolol succinate  12.5 mg Oral Daily  . sodium chloride flush  3 mL Intravenous Q12H    have reviewed scheduled and prn medications.  Assessment/ Plan: Pt is a 82 y.o. yo male who was admitted on 11/12/2015 with recent fall, confusion, and  Hypertensive emergency   1. AoCKD. Baseline Cr 3.8. 4.46 on admission. Improving with IVF  - Follow up renal US  - Has follow up for fistula placement on 5/23 2. Hypertension/Volume - BP elevated to 180-210s/90/110s  -Continue Metoprolol and Hydralazine PRN.   -Follow up Echocardiogram.  3. Anemia - Hemoglobin 11.5>9.6. Continue to monitor. Recommend checking for stabilization of hemoglobin prior to discharge. 4. Diabetes- Home medication includes Levemir 30units and Humalog 20 units, both at night. Per primary team. 5. Shoulder Pain- Patient reports pain is significant. Management per primary team.  Algis Greenhouse. Jerline Pain, MD  [facility] Resident PGY-2 11/14/2015 7:47 AM    Renal Attending: Pt stable.  Clicinally improved.  Will plan to see as scheduled as OP. Seymone Forlenza C'

## 2015-11-14 NOTE — Progress Notes (Signed)
Patient d/c home with family, d/c instructions, RX's and follow up appts reviewed/provided wife verbalized understanding. Patient left floor via wheelchair accompanied by volunteers no c/o pain or shortness of breath at d/c.  Russell Engelstad, Tivis Ringer, RN

## 2015-11-17 LAB — CULTURE, BLOOD (ROUTINE X 2)
CULTURE: NO GROWTH
Culture: NO GROWTH

## 2015-11-30 ENCOUNTER — Other Ambulatory Visit: Payer: Self-pay | Admitting: *Deleted

## 2015-12-03 ENCOUNTER — Telehealth: Payer: Self-pay | Admitting: *Deleted

## 2015-12-03 NOTE — Telephone Encounter (Signed)
Spoke to Mrs Vreeland, Patient has sustained a fall recently and they wish to postpone his surgery until he recovers. They are going to see Dr. Florene Glen on 12-12-15 to discuss his kidney status and then follow up with Dr. Brigitte Pulse. Mrs. Lamarque will call us when he is cleared to have surgery with Dr. Brigitte Pulse.

## 2015-12-21 ENCOUNTER — Other Ambulatory Visit (HOSPITAL_COMMUNITY): Payer: Self-pay | Admitting: *Deleted

## 2015-12-24 ENCOUNTER — Ambulatory Visit (HOSPITAL_COMMUNITY)
Admission: RE | Admit: 2015-12-24 | Discharge: 2015-12-24 | Disposition: A | Discharge status: Home / Self Care | Payer: Medicare Other | Source: Ambulatory Visit | Attending: Nephrology | Admitting: Nephrology

## 2015-12-24 DIAGNOSIS — D509 Iron deficiency anemia, unspecified: Secondary | ICD-10-CM | POA: Insufficient documentation

## 2015-12-24 MED ORDER — SODIUM CHLORIDE 0.9 % IV SOLN
510.0000 mg | INTRAVENOUS | Status: DC
  Administered 2015-12-24: 510 mg via INTRAVENOUS
  Filled 2015-12-24: qty 17

## 2015-12-27 ENCOUNTER — Ambulatory Visit (HOSPITAL_COMMUNITY): Admission: RE | Admit: 2015-12-27 | Payer: Medicare Other | Source: Ambulatory Visit | Admitting: Vascular Surgery

## 2015-12-27 ENCOUNTER — Encounter (HOSPITAL_COMMUNITY): Admission: RE | Payer: Self-pay | Source: Ambulatory Visit

## 2015-12-27 SURGERY — ARTERIOVENOUS (AV) FISTULA CREATION
Anesthesia: Monitor Anesthesia Care | Laterality: Left

## 2015-12-31 ENCOUNTER — Ambulatory Visit (HOSPITAL_COMMUNITY)
Admission: RE | Admit: 2015-12-31 | Discharge: 2015-12-31 | Disposition: A | Discharge status: Home / Self Care | Payer: Medicare Other | Source: Ambulatory Visit | Attending: Nephrology | Admitting: Nephrology

## 2015-12-31 DIAGNOSIS — D509 Iron deficiency anemia, unspecified: Secondary | ICD-10-CM | POA: Diagnosis present

## 2015-12-31 MED ORDER — FERUMOXYTOL INJECTION 510 MG/17 ML
510.0000 mg | INTRAVENOUS | Status: DC
  Administered 2015-12-31: 510 mg via INTRAVENOUS
  Filled 2015-12-31: qty 17

## 2016-01-05 ENCOUNTER — Emergency Department (HOSPITAL_COMMUNITY): Payer: Medicare Other

## 2016-01-05 ENCOUNTER — Inpatient Hospital Stay (HOSPITAL_COMMUNITY)
Admission: EM | Admit: 2016-01-05 | Discharge: 2016-01-12 | DRG: 299 | Disposition: E | Payer: Medicare Other | Attending: Internal Medicine | Admitting: Internal Medicine

## 2016-01-05 ENCOUNTER — Other Ambulatory Visit: Payer: Self-pay

## 2016-01-05 ENCOUNTER — Encounter (HOSPITAL_COMMUNITY): Payer: Self-pay | Admitting: *Deleted

## 2016-01-05 DIAGNOSIS — G9341 Metabolic encephalopathy: Secondary | ICD-10-CM | POA: Diagnosis present

## 2016-01-05 DIAGNOSIS — Z7982 Long term (current) use of aspirin: Secondary | ICD-10-CM

## 2016-01-05 DIAGNOSIS — E1122 Type 2 diabetes mellitus with diabetic chronic kidney disease: Secondary | ICD-10-CM | POA: Diagnosis present

## 2016-01-05 DIAGNOSIS — R739 Hyperglycemia, unspecified: Secondary | ICD-10-CM | POA: Diagnosis present

## 2016-01-05 DIAGNOSIS — Z8673 Personal history of transient ischemic attack (TIA), and cerebral infarction without residual deficits: Secondary | ICD-10-CM | POA: Diagnosis not present

## 2016-01-05 DIAGNOSIS — E1165 Type 2 diabetes mellitus with hyperglycemia: Secondary | ICD-10-CM | POA: Diagnosis present

## 2016-01-05 DIAGNOSIS — L0390 Cellulitis, unspecified: Secondary | ICD-10-CM | POA: Insufficient documentation

## 2016-01-05 DIAGNOSIS — Z515 Encounter for palliative care: Secondary | ICD-10-CM | POA: Diagnosis not present

## 2016-01-05 DIAGNOSIS — L089 Local infection of the skin and subcutaneous tissue, unspecified: Secondary | ICD-10-CM

## 2016-01-05 DIAGNOSIS — E8771 Transfusion associated circulatory overload: Secondary | ICD-10-CM | POA: Diagnosis not present

## 2016-01-05 DIAGNOSIS — H5441 Blindness, right eye, normal vision left eye: Secondary | ICD-10-CM | POA: Diagnosis present

## 2016-01-05 DIAGNOSIS — N179 Acute kidney failure, unspecified: Secondary | ICD-10-CM | POA: Diagnosis present

## 2016-01-05 DIAGNOSIS — J9601 Acute respiratory failure with hypoxia: Secondary | ICD-10-CM | POA: Diagnosis not present

## 2016-01-05 DIAGNOSIS — E1140 Type 2 diabetes mellitus with diabetic neuropathy, unspecified: Secondary | ICD-10-CM | POA: Diagnosis present

## 2016-01-05 DIAGNOSIS — Z79899 Other long term (current) drug therapy: Secondary | ICD-10-CM | POA: Diagnosis not present

## 2016-01-05 DIAGNOSIS — Z809 Family history of malignant neoplasm, unspecified: Secondary | ICD-10-CM

## 2016-01-05 DIAGNOSIS — Z66 Do not resuscitate: Secondary | ICD-10-CM | POA: Diagnosis present

## 2016-01-05 DIAGNOSIS — E1169 Type 2 diabetes mellitus with other specified complication: Secondary | ICD-10-CM | POA: Diagnosis not present

## 2016-01-05 DIAGNOSIS — R0603 Acute respiratory distress: Secondary | ICD-10-CM | POA: Diagnosis present

## 2016-01-05 DIAGNOSIS — E871 Hypo-osmolality and hyponatremia: Secondary | ICD-10-CM | POA: Diagnosis present

## 2016-01-05 DIAGNOSIS — R0902 Hypoxemia: Secondary | ICD-10-CM | POA: Diagnosis present

## 2016-01-05 DIAGNOSIS — Z87891 Personal history of nicotine dependence: Secondary | ICD-10-CM

## 2016-01-05 DIAGNOSIS — E1151 Type 2 diabetes mellitus with diabetic peripheral angiopathy without gangrene: Principal | ICD-10-CM | POA: Diagnosis present

## 2016-01-05 DIAGNOSIS — E118 Type 2 diabetes mellitus with unspecified complications: Secondary | ICD-10-CM | POA: Diagnosis present

## 2016-01-05 DIAGNOSIS — N186 End stage renal disease: Secondary | ICD-10-CM | POA: Diagnosis present

## 2016-01-05 DIAGNOSIS — D649 Anemia, unspecified: Secondary | ICD-10-CM

## 2016-01-05 DIAGNOSIS — I129 Hypertensive chronic kidney disease with stage 1 through stage 4 chronic kidney disease, or unspecified chronic kidney disease: Secondary | ICD-10-CM | POA: Diagnosis present

## 2016-01-05 DIAGNOSIS — Z794 Long term (current) use of insulin: Secondary | ICD-10-CM | POA: Diagnosis not present

## 2016-01-05 DIAGNOSIS — D638 Anemia in other chronic diseases classified elsewhere: Secondary | ICD-10-CM | POA: Diagnosis present

## 2016-01-05 DIAGNOSIS — E11621 Type 2 diabetes mellitus with foot ulcer: Secondary | ICD-10-CM | POA: Diagnosis present

## 2016-01-05 DIAGNOSIS — N189 Chronic kidney disease, unspecified: Secondary | ICD-10-CM

## 2016-01-05 DIAGNOSIS — R0600 Dyspnea, unspecified: Secondary | ICD-10-CM | POA: Diagnosis not present

## 2016-01-05 DIAGNOSIS — I5033 Acute on chronic diastolic (congestive) heart failure: Secondary | ICD-10-CM | POA: Diagnosis present

## 2016-01-05 DIAGNOSIS — L98499 Non-pressure chronic ulcer of skin of other sites with unspecified severity: Secondary | ICD-10-CM

## 2016-01-05 DIAGNOSIS — Z5329 Procedure and treatment not carried out because of patient's decision for other reasons: Secondary | ICD-10-CM | POA: Diagnosis present

## 2016-01-05 DIAGNOSIS — E7800 Pure hypercholesterolemia, unspecified: Secondary | ICD-10-CM | POA: Diagnosis present

## 2016-01-05 DIAGNOSIS — H5440 Blindness, one eye, unspecified eye: Secondary | ICD-10-CM | POA: Diagnosis present

## 2016-01-05 DIAGNOSIS — E11628 Type 2 diabetes mellitus with other skin complications: Secondary | ICD-10-CM | POA: Diagnosis present

## 2016-01-05 DIAGNOSIS — L03115 Cellulitis of right lower limb: Secondary | ICD-10-CM | POA: Diagnosis present

## 2016-01-05 DIAGNOSIS — N184 Chronic kidney disease, stage 4 (severe): Secondary | ICD-10-CM | POA: Diagnosis present

## 2016-01-05 DIAGNOSIS — L97519 Non-pressure chronic ulcer of other part of right foot with unspecified severity: Secondary | ICD-10-CM | POA: Diagnosis present

## 2016-01-05 DIAGNOSIS — R0602 Shortness of breath: Secondary | ICD-10-CM

## 2016-01-05 DIAGNOSIS — Z5309 Procedure and treatment not carried out because of other contraindication: Secondary | ICD-10-CM

## 2016-01-05 DIAGNOSIS — I639 Cerebral infarction, unspecified: Secondary | ICD-10-CM | POA: Diagnosis present

## 2016-01-05 DIAGNOSIS — I509 Heart failure, unspecified: Secondary | ICD-10-CM | POA: Diagnosis not present

## 2016-01-05 DIAGNOSIS — R451 Restlessness and agitation: Secondary | ICD-10-CM | POA: Diagnosis not present

## 2016-01-05 LAB — CBC WITH DIFFERENTIAL/PLATELET
Basophils Absolute: 0 10*3/uL (ref 0.0–0.1)
Basophils Relative: 0 %
EOS PCT: 0 %
Eosinophils Absolute: 0 10*3/uL (ref 0.0–0.7)
HEMATOCRIT: 22.8 % — AB (ref 39.0–52.0)
Hemoglobin: 7.3 g/dL — ABNORMAL LOW (ref 13.0–17.0)
LYMPHS ABS: 1.6 10*3/uL (ref 0.7–4.0)
LYMPHS PCT: 10 %
MCH: 31.5 pg (ref 26.0–34.0)
MCHC: 32 g/dL (ref 30.0–36.0)
MCV: 98.3 fL (ref 78.0–100.0)
MONO ABS: 0.7 10*3/uL (ref 0.1–1.0)
MONOS PCT: 4 %
NEUTROS ABS: 14.3 10*3/uL — AB (ref 1.7–7.7)
Neutrophils Relative %: 86 %
Platelets: 223 10*3/uL (ref 150–400)
RBC: 2.32 MIL/uL — ABNORMAL LOW (ref 4.22–5.81)
RDW: 14.7 % (ref 11.5–15.5)
WBC: 16.6 10*3/uL — ABNORMAL HIGH (ref 4.0–10.5)

## 2016-01-05 LAB — COMPREHENSIVE METABOLIC PANEL
ALBUMIN: 2.8 g/dL — AB (ref 3.5–5.0)
ALT: 13 U/L — AB (ref 17–63)
AST: 15 U/L (ref 15–41)
Alkaline Phosphatase: 52 U/L (ref 38–126)
Anion gap: 11 (ref 5–15)
BUN: 54 mg/dL — AB (ref 6–20)
CHLORIDE: 103 mmol/L (ref 101–111)
CO2: 13 mmol/L — AB (ref 22–32)
CREATININE: 3.96 mg/dL — AB (ref 0.61–1.24)
Calcium: 7.9 mg/dL — ABNORMAL LOW (ref 8.9–10.3)
GFR calc Af Amer: 15 mL/min — ABNORMAL LOW (ref >60)
GFR, EST NON AFRICAN AMERICAN: 13 mL/min — AB (ref >60)
GLUCOSE: 315 mg/dL — AB (ref 65–99)
Potassium: 4.4 mmol/L (ref 3.5–5.1)
SODIUM: 127 mmol/L — AB (ref 135–145)
Total Bilirubin: 0.5 mg/dL (ref 0.3–1.2)
Total Protein: 6 g/dL — ABNORMAL LOW (ref 6.5–8.1)

## 2016-01-05 LAB — CBG MONITORING, ED: GLUCOSE-CAPILLARY: 315 mg/dL — AB (ref 65–99)

## 2016-01-05 LAB — I-STAT CG4 LACTIC ACID, ED: LACTIC ACID, VENOUS: 0.71 mmol/L (ref 0.5–2.0)

## 2016-01-05 LAB — PREPARE RBC (CROSSMATCH)

## 2016-01-05 LAB — LIPASE, BLOOD: Lipase: 31 U/L (ref 11–51)

## 2016-01-05 LAB — GLUCOSE, CAPILLARY: GLUCOSE-CAPILLARY: 181 mg/dL — AB (ref 65–99)

## 2016-01-05 MED ORDER — SODIUM CHLORIDE 0.9% FLUSH
3.0000 mL | Freq: Two times a day (BID) | INTRAVENOUS | Status: DC
  Administered 2016-01-06 – 2016-01-09 (×6): 3 mL via INTRAVENOUS

## 2016-01-05 MED ORDER — INSULIN ASPART 100 UNIT/ML ~~LOC~~ SOLN
10.0000 [IU] | Freq: Once | SUBCUTANEOUS | Status: AC
  Administered 2016-01-05: 10 [IU] via INTRAVENOUS
  Filled 2016-01-05: qty 1

## 2016-01-05 MED ORDER — HEPARIN SODIUM (PORCINE) 5000 UNIT/ML IJ SOLN
5000.0000 [IU] | Freq: Three times a day (TID) | INTRAMUSCULAR | Status: DC
  Administered 2016-01-06 – 2016-01-08 (×8): 5000 [IU] via SUBCUTANEOUS
  Filled 2016-01-05 (×8): qty 1

## 2016-01-05 MED ORDER — ACETAMINOPHEN 325 MG PO TABS: 650.0000 mg | ORAL_TABLET | Freq: Four times a day (QID) | ORAL | Status: DC | PRN

## 2016-01-05 MED ORDER — INSULIN DETEMIR 100 UNIT/ML ~~LOC~~ SOLN
30.0000 [IU] | Freq: Every day | SUBCUTANEOUS | Status: DC
  Administered 2016-01-06: 30 [IU] via SUBCUTANEOUS
  Filled 2016-01-05 (×2): qty 0.3

## 2016-01-05 MED ORDER — AMLODIPINE BESYLATE 10 MG PO TABS
10.0000 mg | ORAL_TABLET | Freq: Every day | ORAL | Status: DC
  Administered 2016-01-06: 10 mg via ORAL
  Filled 2016-01-05: qty 1

## 2016-01-05 MED ORDER — INSULIN ASPART 100 UNIT/ML ~~LOC~~ SOLN
0.0000 [IU] | Freq: Every day | SUBCUTANEOUS | Status: DC
  Administered 2016-01-08: 4 [IU] via SUBCUTANEOUS

## 2016-01-05 MED ORDER — SODIUM CHLORIDE 0.9 % IV SOLN
INTRAVENOUS | Status: DC
  Administered 2016-01-05: 21:00:00 via INTRAVENOUS

## 2016-01-05 MED ORDER — SODIUM CHLORIDE 0.9 % IV BOLUS (SEPSIS)
250.0000 mL | Freq: Once | INTRAVENOUS | Status: AC
  Administered 2016-01-05: 250 mL via INTRAVENOUS

## 2016-01-05 MED ORDER — VANCOMYCIN HCL IN DEXTROSE 1-5 GM/200ML-% IV SOLN
1000.0000 mg | Freq: Once | INTRAVENOUS | Status: AC
  Administered 2016-01-05: 1000 mg via INTRAVENOUS
  Filled 2016-01-05: qty 200

## 2016-01-05 MED ORDER — ENSURE ENLIVE PO LIQD: 237.0000 mL | Freq: Two times a day (BID) | ORAL | Status: DC

## 2016-01-05 MED ORDER — SODIUM CHLORIDE 0.9 % IV SOLN
INTRAVENOUS | Status: DC
  Administered 2016-01-05: 20:00:00 via INTRAVENOUS

## 2016-01-05 MED ORDER — ZOLPIDEM TARTRATE 5 MG PO TABS
5.0000 mg | ORAL_TABLET | Freq: Once | ORAL | Status: AC
  Administered 2016-01-06: 5 mg via ORAL
  Filled 2016-01-05: qty 1

## 2016-01-05 MED ORDER — FUROSEMIDE 10 MG/ML IJ SOLN
40.0000 mg | Freq: Once | INTRAMUSCULAR | Status: AC
  Administered 2016-01-06: 40 mg via INTRAVENOUS
  Filled 2016-01-05: qty 4

## 2016-01-05 MED ORDER — SODIUM CHLORIDE 0.9 % IV SOLN: INTRAVENOUS | Status: DC

## 2016-01-05 MED ORDER — ACETAMINOPHEN 650 MG RE SUPP: 650.0000 mg | Freq: Four times a day (QID) | RECTAL | Status: DC | PRN

## 2016-01-05 MED ORDER — INSULIN ASPART 100 UNIT/ML ~~LOC~~ SOLN
0.0000 [IU] | Freq: Three times a day (TID) | SUBCUTANEOUS | Status: DC
  Administered 2016-01-06: 1 [IU] via SUBCUTANEOUS
  Administered 2016-01-06: 2 [IU] via SUBCUTANEOUS
  Administered 2016-01-07 (×2): 1 [IU] via SUBCUTANEOUS

## 2016-01-05 MED ORDER — ASPIRIN EC 81 MG PO TBEC
81.0000 mg | DELAYED_RELEASE_TABLET | Freq: Every day | ORAL | Status: DC
  Administered 2016-01-06 – 2016-01-07 (×2): 81 mg via ORAL
  Filled 2016-01-05 (×2): qty 1

## 2016-01-05 MED ORDER — SODIUM CHLORIDE 0.9 % IV SOLN: Freq: Once | INTRAVENOUS | Status: DC

## 2016-01-05 MED ORDER — CARVEDILOL 3.125 MG PO TABS
3.1250 mg | ORAL_TABLET | Freq: Two times a day (BID) | ORAL | Status: DC
  Administered 2016-01-06 – 2016-01-07 (×4): 3.125 mg via ORAL
  Filled 2016-01-05 (×4): qty 1

## 2016-01-05 NOTE — H&P (Addendum)
TRH H&P   Patient Demographics:    [name], is a 82 y.o. male  MRN: [id number]   DOB - [birth year]  Admit Date - 12/30/2015  Outpatient Primary MD for the patient is Mayra Neer, MD  Referring MD/NP/PA: Aundria Rud   Outpatient Specialists:  [facility]  Patient coming from:  home  Chief Complaint  Patient presents with  . Foot Swelling  . Fever      HPI:    [name]  is a 82 y.o. male, ESRD not on HD, Dm2, w c/o 1st digit red toe.  Pt is not quite sure how long it has been going on possibly 37monthor longer.  Pt denies fever, chills, discharge.  Pt presented to ED for evaluation.   In ED,  Xray negative for osteomyelitis. Creatinine elevated.  Pt is going to be admitted for cellulitis , skin ulcer.       Review of systems:    In addition to the HPI above,  No Fever-chills, No Headache, No changes with Vision or hearing, No problems swallowing food or Liquids, No Chest pain, Cough or Shortness of Breath, No Abdominal pain, No Nausea or Vommitting, Bowel movements are regular, No Blood in stool or Urine, No dysuria, No new skin rashes or bruises, No new joints pains-aches,  No new weakness, tingling, numbness in any extremity, No recent weight gain or loss, No polyuria, polydypsia or polyphagia, No significant Mental Stressors.  A full 10 point Review of Systems was done, except as stated above, all other Review of Systems were negative.   With Past History of the following :    Past Medical History  Diagnosis Date  . Diabetes mellitus   . Hypertension   . Hypercholesteremia   . [place]   . PNA (pneumonia)   . Detached retina, right   . Cancer [place]     lung ca dx'd 2008      Past Surgical History  Procedure Laterality Date  . Appendectomy    . Cataract extraction        Social History:     Social History    Substance Use Topics  . Smoking status: Former Smoker -- 1.00 packs/day for 648years    Quit date: 07/14/2004  . Smokeless tobacco: Not on file  . Alcohol Use: No     Lives - at home  Mobility -      Family History :     Family History  Problem Relation Age of Onset  . Cancer Mother   . Cancer Brother       Home Medications:   Prior to Admission medications   Medication Sig Start Date End Date Taking? Authorizing Provider  acetaminophen (TYLENOL) 500 MG tablet Take 1,000 mg by mouth every 8 (eight) hours as needed for moderate pain.   Yes Historical Provider, MD  amLODipine (NORVASC) 10  MG tablet Take 10 mg by mouth daily. 01/02/16  Yes Historical Provider, MD  aspirin 81 MG tablet Take 81 mg by mouth daily.   Yes Historical Provider, MD  B Complex-Folic Acid (SUPER B COMPLEX MAXI) TABS Take 1 tablet by mouth daily.   Yes Historical Provider, MD  Capsaicin-Menthol (SALONPAS GEL EX) Apply 1 application topically daily as needed (for pain).   Yes Historical Provider, MD  carvedilol (COREG) 3.125 MG tablet Take 1 tablet (3.125 mg total) by mouth 2 (two) times daily with a meal. 11/14/15  Yes Bonnell Public, MD  insulin lispro (HUMALOG) 100 UNIT/ML injection Inject 20 Units into the skin every evening. 20 units in evening only   Yes Historical Provider, MD  Liniments (SALONPAS PAIN RELIEF PATCH EX) Apply 1 patch topically daily as needed (for pain).   Yes Historical Provider, MD  amLODipine (NORVASC) 5 MG tablet Take 1 tablet (5 mg total) by mouth daily. 11/14/15   Bonnell Public, MD  insulin detemir (LEVEMIR) 100 UNIT/ML injection Inject 30 Units into the skin at bedtime. Reported on 07/02/2015    Historical Provider, MD     Allergies:    No Known Allergies   Physical Exam:   Vitals  Blood pressure 121/73, pulse 85, temperature 98 F (36.7 C), temperature source Oral, resp. rate 22, SpO2 98 %.   1. General  lying in bed in NAD,    2. Normal affect and insight,  Not Suicidal or Homicidal, Awake Alert, Oriented X 3.  3. No F.N deficits, ALL C.Nerves Intact, Strength 5/5 all 4 extremities, Sensation intact all 4 extremities, Plantars down going.  4. Ears and Eyes appear Normal, Conjunctivae clear, PERRLA. Moist Oral Mucosa.  5. Supple Neck, No JVD, No cervical lymphadenopathy appriciated, No Carotid Bruits.  6. Symmetrical Chest wall movement, Good air movement bilaterally, CTAB.  7. RRR, No Gallops, Rubs or Murmurs, No Parasternal Heave.  8. Positive Bowel Sounds, Abdomen Soft, No tenderness, No organomegaly appriciated,No rebound -guarding or rigidity.  9.  No Cyanosis, Normal Skin Turgor  10. Good muscle tone,  no effusions, Normal ROM.  11. No Palpable Lymph Nodes in Neck or Axillae  1st digit redness, extending to the dorsum of the right foot, 2.5cm area.      Data Review:    CBC  Recent Labs Lab 12/25/2015 2010  WBC 16.6*  HGB 7.3*  HCT 22.8*  PLT 223  MCV 98.3  MCH 31.5  MCHC 32.0  RDW 14.7  LYMPHSABS 1.6  MONOABS 0.7  EOSABS 0.0  BASOSABS 0.0   ------------------------------------------------------------------------------------------------------------------  Chemistries   Recent Labs Lab 01/11/2016 2010  NA 127*  K 4.4  CL 103  CO2 13*  GLUCOSE 315*  BUN 54*  CREATININE 3.96*  CALCIUM 7.9*  AST 15  ALT 13*  ALKPHOS 52  BILITOT 0.5   ------------------------------------------------------------------------------------------------------------------ estimated creatinine clearance is 15.3 mL/min (by C-G formula based on Cr of 3.96). ------------------------------------------------------------------------------------------------------------------ No results for input(s): TSH, T4TOTAL, T3FREE, THYROIDAB in the last 72 hours.  Invalid input(s): FREET3  Coagulation profile No results for input(s): INR, PROTIME in the last 168  hours. ------------------------------------------------------------------------------------------------------------------- No results for input(s): DDIMER in the last 72 hours. -------------------------------------------------------------------------------------------------------------------  Cardiac Enzymes No results for input(s): CKMB, TROPONINI, MYOGLOBIN in the last 168 hours.  Invalid input(s): CK ------------------------------------------------------------------------------------------------------------------ No results found for: BNP   ---------------------------------------------------------------------------------------------------------------  Urinalysis    Component Value Date/Time   COLORURINE YELLOW 10/25/2013 [place] 10/25/2013 1951  LABSPEC 1.016 10/25/2013 1951   PHURINE 5.5 10/25/2013 1951   GLUCOSEU 100* 10/25/2013 1951   HGBUR TRACE* 10/25/2013 [place] NEGATIVE 10/25/2013 [place] NEGATIVE 10/25/2013 1951   PROTEINUR >300* 10/25/2013 1951   UROBILINOGEN 0.2 10/25/2013 1951   NITRITE NEGATIVE 10/25/2013 1951   LEUKOCYTESUR NEGATIVE 10/25/2013 1951    ----------------------------------------------------------------------------------------------------------------   Imaging Results:    Dg Chest 2 View  12/23/2015  CLINICAL DATA:  Acute onset of right foot pain and swelling. Fever. Recent fall. Initial encounter. EXAM: CHEST  2 VIEW COMPARISON:  Chest radiograph performed 10/25/2013, and CT of the chest performed 06/25/2015 FINDINGS: The lungs are well-aerated. Vascular congestion is noted. Mild bibasilar opacities may reflect atelectasis or possibly mild infection. Postoperative change is noted at the right midlung. No pleural effusion or pneumothorax is seen. The heart is normal in size; the mediastinal contour is within normal limits. No acute osseous abnormalities are seen. IMPRESSION: Vascular congestion noted. Mild  bibasilar opacities may reflect atelectasis or possibly mild infection. Electronically Signed   By: Garald Balding M.D.   On: 01/08/2016 20:44   Dg Foot Complete Right  01/07/2016  CLINICAL DATA:  Acute onset of right foot swelling and erythema. Fever. Recent fall. Initial encounter. EXAM: RIGHT FOOT COMPLETE - 3+ VIEW COMPARISON:  None. FINDINGS: There is no evidence of fracture or dislocation. No osseous erosions are identified. The joint spaces are preserved. There is no evidence of talar subluxation; the subtalar joint is unremarkable in appearance. A plantar calcaneal spur is noted. A small os peroneum is seen. Diffuse vascular calcifications are seen. A soft tissue laceration is noted along the lateral aspect of the fifth toe, with diffuse soft tissue swelling about the lateral forefoot. IMPRESSION: 1. No evidence of fracture or dislocation. No osseous erosions seen. 2. Soft tissue laceration along the lateral aspect of the fifth toe, with diffuse soft tissue swelling about the lateral forefoot. 3. Diffuse vascular calcifications seen. Electronically Signed   By: Garald Balding M.D.   On: 12/19/2015 20:46       Assessment & Plan:    Principal Problem:   Cellulitis Active Problems:   Chronic renal failure   Diabetes mellitus with complication (HCC)   Right foot infection   Absolute anemia    1. Cellulitis Blood cx x2,  Check esr vanco iv (given in ED), zosyn iv   2. ESRD Pt declines hemodialysis  3. Anemia Check cbc in am,  Transfuse 2 units prbc  4. Dm2 fsbs q4h, iss  5. Code Status DNR.     DVT Prophylaxis Heparin -  SCDs   AM Labs Ordered, also please review Full Orders  Family Communication: Admission, patients condition and plan of care including tests being ordered have been discussed with the patient  who indicate understanding and agree with the plan and Code Status.  Code Status FULL CODE  Likely DC to    Condition GUARDED    Consults called:    Admission status: inpatient  Time spent in minutes : 45 minutes   Jani Gravel M.D on 12/23/2015 at 10:33 PM  Between 7am to 7pm - Pager - [phone]. After 7pm go to [email or web address] - password [facility]  [facility] - Office  [phone]

## 2016-01-05 NOTE — ED Provider Notes (Addendum)
CSN: [id number]     Arrival date & time 01/06/2016  1826 History   First MD Initiated Contact with Patient 12/15/2015 1856     Chief Complaint  Patient presents with  . Foot Swelling  . Fever     (Consider location/radiation/quality/duration/timing/severity/associated sxs/prior Treatment) Patient is a 82 y.o. male presenting with fever. The history is provided by the patient.  Fever Associated symptoms: no chest pain, no confusion, no congestion, no dysuria and no headaches   Patient with swelling and redness to the right foot for the past few days. Patient reports possible fever yesterday. Patient has a history of diabetes. Patient has a history of anemia. 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 Patient stated blood sugars at home were in the 100 range. Patient's diabetes is treated with insulin.  Past Medical History  Diagnosis Date  . Diabetes mellitus   . Hypertension   . Hypercholesteremia   . Stroke ([place])   . PNA (pneumonia)   . Detached retina, right   . Cancer ([place])     lung ca dx'd 2008   Past Surgical History  Procedure Laterality Date  . Appendectomy     Family History  Problem Relation Age of Onset  . Cancer Mother   . Cancer Brother    Social History  Substance Use Topics  . Smoking status: Former Smoker -- 1.00 packs/day for 2 years    Quit date: 07/14/2004  . Smokeless tobacco: None  . Alcohol Use: No    Review of Systems  Constitutional: Negative for fever.  HENT: Negative for congestion.   Eyes: Positive for visual disturbance.  Respiratory: Negative for shortness of breath.   Cardiovascular: Negative for chest pain.  Gastrointestinal: Negative for abdominal pain.  Genitourinary: Negative for dysuria.  Musculoskeletal: Negative for back pain.  Skin: Positive for wound.  Neurological: Negative for headaches.  Hematological: Does not bruise/bleed easily.  Psychiatric/Behavioral: Negative for confusion.      Allergies   Review of patient's allergies indicates no known allergies.  Home Medications   Prior to Admission medications   Medication Sig Start Date End Date Taking? Authorizing Provider  acetaminophen (TYLENOL) 500 MG tablet Take 1,000 mg by mouth every 8 (eight) hours as needed for moderate pain.   Yes Historical Provider, MD  amLODipine (NORVASC) 10 MG tablet Take 10 mg by mouth daily. 01/02/16  Yes Historical Provider, MD  aspirin 81 MG tablet Take 81 mg by mouth daily.   Yes Historical Provider, MD  B Complex-Folic Acid (SUPER B COMPLEX MAXI) TABS Take 1 tablet by mouth daily.   Yes Historical Provider, MD  Capsaicin-Menthol (SALONPAS GEL EX) Apply 1 application topically daily as needed (for pain).   Yes Historical Provider, MD  carvedilol (COREG) 3.125 MG tablet Take 1 tablet (3.125 mg total) by mouth 2 (two) times daily with a meal. 11/14/15  Yes Bonnell Public, MD  insulin lispro (HUMALOG) 100 UNIT/ML injection Inject 20 Units into the skin every evening. 20 units in evening only   Yes Historical Provider, MD  Liniments (SALONPAS PAIN RELIEF PATCH EX) Apply 1 patch topically daily as needed (for pain).   Yes Historical Provider, MD  amLODipine (NORVASC) 5 MG tablet Take 1 tablet (5 mg total) by mouth daily. 11/14/15   Bonnell Public, MD  insulin detemir (LEVEMIR) 100 UNIT/ML injection Inject 30 Units into the skin at bedtime. Reported on 07/02/2015    Historical Provider, MD   BP  137/68 mmHg  Pulse 88  Temp(Src) 98.3 F (36.8 C) (Oral)  Resp 22  SpO2 98% Physical Exam  Constitutional: He is oriented to person, place, and time. He appears well-developed and well-nourished. No distress.  HENT:  Head: Normocephalic and atraumatic.  Mouth/Throat: Oropharynx is clear and moist.  Eyes:  No vision in the right eye minimal vision in the left eye.  Neck: Normal range of motion.  Cardiovascular: Normal rate, regular rhythm and normal heart sounds.   No murmur heard. Pulmonary/Chest: Effort  normal and breath sounds normal. No respiratory distress.  Abdominal: Soft. Bowel sounds are normal. There is no tenderness.  Musculoskeletal: Normal range of motion.  Swelling to the right foot mostly at the lateral aspects of the small toe. A corn on the lateral aspect of the small toe oozing clear fluid. Tenderness to palpation to the forefoot and the lateral aspect of the foot with erythema and increased warmth. Also swelling in erythema to the ball of foot. Cap refill 2 seconds.  Neurological: He is alert and oriented to person, place, and time. No cranial nerve deficit. He exhibits normal muscle tone. Coordination normal.  Skin: Skin is warm. No rash noted.  Nursing note and vitals reviewed.   ED Course  Procedures (including critical care time) Labs Review Labs Reviewed  COMPREHENSIVE METABOLIC PANEL - Abnormal; Notable for the following:    Sodium 127 (*)    CO2 13 (*)    Glucose, Bld 315 (*)    BUN 54 (*)    Creatinine, Ser 3.96 (*)    Calcium 7.9 (*)    Total Protein 6.0 (*)    Albumin 2.8 (*)    ALT 13 (*)    GFR calc non Af Amer 13 (*)    GFR calc Af Amer 15 (*)    All other components within normal limits  CBC WITH DIFFERENTIAL/PLATELET - Abnormal; Notable for the following:    WBC 16.6 (*)    RBC 2.32 (*)    Hemoglobin 7.3 (*)    HCT 22.8 (*)    Neutro Abs 14.3 (*)    All other components within normal limits  CBG MONITORING, ED - Abnormal; Notable for the following:    Glucose-Capillary 315 (*)    All other components within normal limits  CULTURE, BLOOD (ROUTINE X 2)  CULTURE, BLOOD (ROUTINE X 2)  LIPASE, BLOOD  I-STAT CG4 LACTIC ACID, ED  TYPE AND SCREEN   Results for orders placed or performed during the hospital encounter of 01/01/2016  Comprehensive metabolic panel  Result Value Ref Range   Sodium 127 (L) 135 - 145 mmol/L   Potassium 4.4 3.5 - 5.1 mmol/L   Chloride 103 101 - 111 mmol/L   CO2 13 (L) 22 - 32 mmol/L   Glucose, Bld 315 (H) 65 - 99 mg/dL    BUN 54 (H) 6 - 20 mg/dL   Creatinine, Ser 3.96 (H) 0.61 - 1.24 mg/dL   Calcium 7.9 (L) 8.9 - 10.3 mg/dL   Total Protein 6.0 (L) 6.5 - 8.1 g/dL   Albumin 2.8 (L) 3.5 - 5.0 g/dL   AST 15 15 - 41 U/L   ALT 13 (L) 17 - 63 U/L   Alkaline Phosphatase 52 38 - 126 U/L   Total Bilirubin 0.5 0.3 - 1.2 mg/dL   GFR calc non Af Amer 13 (L) >60 mL/min   GFR calc Af Amer 15 (L) >60 mL/min   Anion gap 11 5 - 15  Lipase, blood  Result Value Ref Range   Lipase 31 11 - 51 U/L  CBC with Differential/Platelet  Result Value Ref Range   WBC 16.6 (H) 4.0 - 10.5 K/uL   RBC 2.32 (L) 4.22 - 5.81 MIL/uL   Hemoglobin 7.3 (L) 13.0 - 17.0 g/dL   HCT 22.8 (L) 39.0 - 52.0 %   MCV 98.3 78.0 - 100.0 fL   MCH 31.5 26.0 - 34.0 pg   MCHC 32.0 30.0 - 36.0 g/dL   RDW 14.7 11.5 - 15.5 %   Platelets 223 150 - 400 K/uL   Neutrophils Relative % 86 %   Neutro Abs 14.3 (H) 1.7 - 7.7 K/uL   Lymphocytes Relative 10 %   Lymphs Abs 1.6 0.7 - 4.0 K/uL   Monocytes Relative 4 %   Monocytes Absolute 0.7 0.1 - 1.0 K/uL   Eosinophils Relative 0 %   Eosinophils Absolute 0.0 0.0 - 0.7 K/uL   Basophils Relative 0 %   Basophils Absolute 0.0 0.0 - 0.1 K/uL  POC CBG, ED  Result Value Ref Range   Glucose-Capillary 315 (H) 65 - 99 mg/dL   Comment 1 Notify RN    Comment 2 Call MD NNP PA CNM    Comment 3 Document in Chart   I-Stat CG4 Lactic Acid, ED  Result Value Ref Range   Lactic Acid, Venous 0.71 0.5 - 2.0 mmol/L    Imaging Review Dg Chest 2 View  01/02/2016  CLINICAL DATA:  Acute onset of right foot pain and swelling. Fever. Recent fall. Initial encounter. EXAM: CHEST  2 VIEW COMPARISON:  Chest radiograph performed 10/25/2013, and CT of the chest performed 06/25/2015 FINDINGS: The lungs are well-aerated. Vascular congestion is noted. Mild bibasilar opacities may reflect atelectasis or possibly mild infection. Postoperative change is noted at the right midlung. No pleural effusion or pneumothorax is seen. The heart is normal  in size; the mediastinal contour is within normal limits. No acute osseous abnormalities are seen. IMPRESSION: Vascular congestion noted. Mild bibasilar opacities may reflect atelectasis or possibly mild infection. Electronically Signed   By: Garald Balding M.D.   On: 01/04/2016 20:44   Dg Foot Complete Right  01/03/2016  CLINICAL DATA:  Acute onset of right foot swelling and erythema. Fever. Recent fall. Initial encounter. EXAM: RIGHT FOOT COMPLETE - 3+ VIEW COMPARISON:  None. FINDINGS: There is no evidence of fracture or dislocation. No osseous erosions are identified. The joint spaces are preserved. There is no evidence of talar subluxation; the subtalar joint is unremarkable in appearance. A plantar calcaneal spur is noted. A small os peroneum is seen. Diffuse vascular calcifications are seen. A soft tissue laceration is noted along the lateral aspect of the fifth toe, with diffuse soft tissue swelling about the lateral forefoot. IMPRESSION: 1. No evidence of fracture or dislocation. No osseous erosions seen. 2. Soft tissue laceration along the lateral aspect of the fifth toe, with diffuse soft tissue swelling about the lateral forefoot. 3. Diffuse vascular calcifications seen. Electronically Signed   By: Garald Balding M.D.   On: 12/27/2015 20:46   I have personally reviewed and evaluated these images and lab results as part of my medical decision-making.   EKG Interpretation   Date/Time:  Saturday January 05 2016 19:26:21 EDT Ventricular Rate:  89 PR Interval:    QRS Duration: 94 QT Interval:  349 QTC Calculation: 425 R Axis:   65 Text Interpretation:  Sinus rhythm Atrial premature complexes Confirmed by  Rogene Houston  MD,  Lunabella Badgett [phone]) on 12/17/2015 7:52:56 PM      MDM   Final diagnoses:  Right foot infection  Anemia, unspecified anemia type  Hyperglycemia    Patient with known history of diabetes. Patient also with difficulty with a anemia had blood transfusion and has received iron.  They're not sure why it's dropping. Patient presents today with right foot pain, swelling and redness. Patient has a long-standing corn on the small toe and the infection seemed to start in that area with most of the swelling there redness to the top of the foot increased warmth. X-ray showed no bony involvement no soft tissue gas. Patient's anemia is more pronounced at 7.3. Type and screen ordered admitting team aware they will decide whether they will transfuse. Patient with a leukocytosis. Blood sugars markedly elevated at 3:15. CO2 is low but lactic acid is normal. Patient will be given some IV insulin.  Patient will be admitted to the telemetry unit due to the marked renal insufficiency. Patient started on IV vancomycin will receive some IV insulin, blood culture sent. Patient without signs of sepsis at this time.    Fredia Sorrow, MD 12/23/2015 2115  Fredia Sorrow, MD 12/26/2015 2115

## 2016-01-05 NOTE — ED Notes (Signed)
Patient transported to X-ray 

## 2016-01-05 NOTE — ED Notes (Signed)
Pt reports swelling and redness to right foot for several days. Also had fever yesterday. Reports having a recent fall on Tuesday.

## 2016-01-05 NOTE — ED Notes (Signed)
RN made aware of elevated CBG 

## 2016-01-06 ENCOUNTER — Inpatient Hospital Stay (HOSPITAL_COMMUNITY): Payer: Medicare Other

## 2016-01-06 ENCOUNTER — Other Ambulatory Visit (HOSPITAL_COMMUNITY): Payer: Medicare Other

## 2016-01-06 DIAGNOSIS — L089 Local infection of the skin and subcutaneous tissue, unspecified: Secondary | ICD-10-CM

## 2016-01-06 DIAGNOSIS — I639 Cerebral infarction, unspecified: Secondary | ICD-10-CM

## 2016-01-06 DIAGNOSIS — N184 Chronic kidney disease, stage 4 (severe): Secondary | ICD-10-CM | POA: Diagnosis present

## 2016-01-06 DIAGNOSIS — I509 Heart failure, unspecified: Secondary | ICD-10-CM

## 2016-01-06 DIAGNOSIS — E1169 Type 2 diabetes mellitus with other specified complication: Secondary | ICD-10-CM

## 2016-01-06 DIAGNOSIS — R0603 Acute respiratory distress: Secondary | ICD-10-CM | POA: Diagnosis present

## 2016-01-06 DIAGNOSIS — E8771 Transfusion associated circulatory overload: Secondary | ICD-10-CM | POA: Diagnosis present

## 2016-01-06 DIAGNOSIS — R0600 Dyspnea, unspecified: Secondary | ICD-10-CM

## 2016-01-06 DIAGNOSIS — H5440 Blindness, one eye, unspecified eye: Secondary | ICD-10-CM | POA: Diagnosis present

## 2016-01-06 DIAGNOSIS — H5441 Blindness, right eye, normal vision left eye: Secondary | ICD-10-CM

## 2016-01-06 DIAGNOSIS — E118 Type 2 diabetes mellitus with unspecified complications: Secondary | ICD-10-CM

## 2016-01-06 DIAGNOSIS — E11628 Type 2 diabetes mellitus with other skin complications: Secondary | ICD-10-CM | POA: Diagnosis present

## 2016-01-06 LAB — COMPREHENSIVE METABOLIC PANEL
ALBUMIN: 2.7 g/dL — AB (ref 3.5–5.0)
ALT: 14 U/L — ABNORMAL LOW (ref 17–63)
ANION GAP: 10 (ref 5–15)
AST: 15 U/L (ref 15–41)
Alkaline Phosphatase: 55 U/L (ref 38–126)
BILIRUBIN TOTAL: 0.9 mg/dL (ref 0.3–1.2)
BUN: 56 mg/dL — AB (ref 6–20)
CHLORIDE: 106 mmol/L (ref 101–111)
CO2: 14 mmol/L — AB (ref 22–32)
Calcium: 8 mg/dL — ABNORMAL LOW (ref 8.9–10.3)
Creatinine, Ser: 4.04 mg/dL — ABNORMAL HIGH (ref 0.61–1.24)
GFR calc Af Amer: 14 mL/min — ABNORMAL LOW (ref >60)
GFR calc non Af Amer: 12 mL/min — ABNORMAL LOW (ref >60)
GLUCOSE: 202 mg/dL — AB (ref 65–99)
POTASSIUM: 4.4 mmol/L (ref 3.5–5.1)
SODIUM: 130 mmol/L — AB (ref 135–145)
TOTAL PROTEIN: 6.4 g/dL — AB (ref 6.5–8.1)

## 2016-01-06 LAB — BLOOD GAS, ARTERIAL
ACID-BASE DEFICIT: 10.2 mmol/L — AB (ref 0.0–2.0)
Bicarbonate: 14.6 mEq/L — ABNORMAL LOW (ref 20.0–24.0)
DRAWN BY: 283381
FIO2: 0.21
O2 SAT: 93.6 %
PCO2 ART: 29 mmHg — AB (ref 35.0–45.0)
Patient temperature: 98.6
TCO2: 15.5 mmol/L (ref 0–100)
pH, Arterial: 7.323 — ABNORMAL LOW (ref 7.350–7.450)
pO2, Arterial: 72 mmHg — ABNORMAL LOW (ref 80.0–100.0)

## 2016-01-06 LAB — TRANSFUSION REACTION
DAT C3: NEGATIVE
POST RXN DAT IGG: NEGATIVE

## 2016-01-06 LAB — URINALYSIS W MICROSCOPIC (NOT AT ARMC)
Bilirubin Urine: NEGATIVE
GLUCOSE, UA: NEGATIVE mg/dL
KETONES UR: NEGATIVE mg/dL
LEUKOCYTES UA: NEGATIVE
Nitrite: NEGATIVE
PROTEIN: 100 mg/dL — AB
Specific Gravity, Urine: 1.014 (ref 1.005–1.030)
pH: 5 (ref 5.0–8.0)

## 2016-01-06 LAB — ECHOCARDIOGRAM COMPLETE
AO mean calculated velocity dopler: 156 cm/s
AV Area mean vel: 1.22 cm2
AV Mean grad: 12 mmHg
AV Peak grad: 24 mmHg
AV area mean vel ind: 0.59 cm2/m2
AV pk vel: 243 cm/s
AV vel: 1.5
AVA: 1.5 cm2
AVAREAVTI: 1.22 cm2
AVAREAVTIIND: 0.73 cm2/m2
AVCELMEANRAT: 0.35
Ao pk vel: 0.35 m/s
CHL CUP AV PEAK INDEX: 0.59
CHL CUP AV VALUE AREA INDEX: 0.73
FS: 15 % — AB (ref 28–44)
IVS/LV PW RATIO, ED: 1.07
LA diam index: 1.94 cm/m2
LA vol A4C: 85.8 ml
LASIZE: 40 mm
LDCA: 3.46 cm2
LEFT ATRIUM END SYS DIAM: 40 mm
LV PW d: 8.99 mm — AB (ref 0.6–1.1)
LVOT SV: 66 mL
LVOT VTI: 19.2 cm
LVOTD: 21 mm
LVOTPV: 85.7 cm/s
LVOTVTI: 0.43 cm
VTI: 44.2 cm

## 2016-01-06 LAB — C-REACTIVE PROTEIN: CRP: 19.1 mg/dL — AB (ref <1.0)

## 2016-01-06 LAB — LACTIC ACID, PLASMA
LACTIC ACID, VENOUS: 0.7 mmol/L (ref 0.5–2.0)
Lactic Acid, Venous: 0.6 mmol/L (ref 0.5–2.0)

## 2016-01-06 LAB — CBC
HEMATOCRIT: 25.8 % — AB (ref 39.0–52.0)
HEMOGLOBIN: 8.4 g/dL — AB (ref 13.0–17.0)
MCH: 31 pg (ref 26.0–34.0)
MCHC: 32.6 g/dL (ref 30.0–36.0)
MCV: 95.2 fL (ref 78.0–100.0)
Platelets: 222 10*3/uL (ref 150–400)
RBC: 2.71 MIL/uL — ABNORMAL LOW (ref 4.22–5.81)
RDW: 16.4 % — ABNORMAL HIGH (ref 11.5–15.5)
WBC: 16.4 10*3/uL — ABNORMAL HIGH (ref 4.0–10.5)

## 2016-01-06 LAB — GLUCOSE, CAPILLARY
GLUCOSE-CAPILLARY: 171 mg/dL — AB (ref 65–99)
GLUCOSE-CAPILLARY: 139 mg/dL — AB (ref 65–99)
GLUCOSE-CAPILLARY: 76 mg/dL (ref 65–99)
GLUCOSE-CAPILLARY: 137 mg/dL — AB (ref 65–99)

## 2016-01-06 LAB — PREALBUMIN: PREALBUMIN: 14.1 mg/dL — AB (ref 18–38)

## 2016-01-06 LAB — SEDIMENTATION RATE: SED RATE: 111 mm/h — AB (ref 0–16)

## 2016-01-06 LAB — MRSA PCR SCREENING: MRSA by PCR: NEGATIVE

## 2016-01-06 MED ORDER — IPRATROPIUM-ALBUTEROL 0.5-2.5 (3) MG/3ML IN SOLN
3.0000 mL | RESPIRATORY_TRACT | Status: DC
  Administered 2016-01-06 – 2016-01-08 (×12): 3 mL via RESPIRATORY_TRACT
  Filled 2016-01-06 (×13): qty 3

## 2016-01-06 MED ORDER — DEXTROSE 5 % IV SOLN
120.0000 mg | Freq: Once | INTRAVENOUS | Status: AC
  Administered 2016-01-06: 120 mg via INTRAVENOUS
  Filled 2016-01-06 (×2): qty 12

## 2016-01-06 MED ORDER — FUROSEMIDE 10 MG/ML IJ SOLN
INTRAMUSCULAR | Status: AC
  Administered 2016-01-06: 60 mg
  Filled 2016-01-06: qty 6

## 2016-01-06 MED ORDER — CETYLPYRIDINIUM CHLORIDE 0.05 % MT LIQD
7.0000 mL | Freq: Two times a day (BID) | OROMUCOSAL | Status: DC
  Administered 2016-01-06 – 2016-01-09 (×6): 7 mL via OROMUCOSAL

## 2016-01-06 MED ORDER — FUROSEMIDE 10 MG/ML IJ SOLN
60.0000 mg | Freq: Once | INTRAMUSCULAR | Status: AC
  Administered 2016-01-06: 60 mg via INTRAVENOUS

## 2016-01-06 MED ORDER — HYDRALAZINE HCL 25 MG PO TABS
25.0000 mg | ORAL_TABLET | Freq: Three times a day (TID) | ORAL | Status: DC
  Administered 2016-01-06 – 2016-01-07 (×4): 25 mg via ORAL
  Filled 2016-01-06 (×5): qty 1

## 2016-01-06 MED ORDER — CEFEPIME HCL 2 G IJ SOLR
500.0000 mg | INTRAMUSCULAR | Status: DC
  Administered 2016-01-06 – 2016-01-08 (×3): 500 mg via INTRAVENOUS
  Filled 2016-01-06 (×4): qty 0.5

## 2016-01-06 MED ORDER — ISOSORBIDE MONONITRATE ER 30 MG PO TB24
30.0000 mg | ORAL_TABLET | Freq: Every day | ORAL | Status: DC
Start: 2016-01-06 — End: 2016-01-09
  Administered 2016-01-06 – 2016-01-07 (×2): 30 mg via ORAL
  Filled 2016-01-06 (×2): qty 1

## 2016-01-06 MED ORDER — ZOLPIDEM TARTRATE 5 MG PO TABS
5.0000 mg | ORAL_TABLET | Freq: Once | ORAL | Status: DC
  Filled 2016-01-06: qty 1

## 2016-01-06 MED ORDER — VANCOMYCIN HCL IN DEXTROSE 1-5 GM/200ML-% IV SOLN
1000.0000 mg | Freq: Once | INTRAVENOUS | Status: AC
  Administered 2016-01-06: 1000 mg via INTRAVENOUS
  Filled 2016-01-06: qty 200

## 2016-01-06 MED ORDER — METRONIDAZOLE IN NACL 5-0.79 MG/ML-% IV SOLN
500.0000 mg | Freq: Three times a day (TID) | INTRAVENOUS | Status: DC
  Administered 2016-01-06 – 2016-01-09 (×9): 500 mg via INTRAVENOUS
  Filled 2016-01-06 (×11): qty 100

## 2016-01-06 MED ORDER — DIPHENHYDRAMINE HCL 25 MG PO CAPS
25.0000 mg | ORAL_CAPSULE | Freq: Once | ORAL | Status: AC
Start: 2016-01-06 — End: 2016-01-06
  Administered 2016-01-06: 25 mg via ORAL
  Filled 2016-01-06: qty 1

## 2016-01-06 MED ORDER — LEVALBUTEROL HCL 0.63 MG/3ML IN NEBU
0.6300 mg | INHALATION_SOLUTION | Freq: Four times a day (QID) | RESPIRATORY_TRACT | Status: DC | PRN
  Administered 2016-01-06: 0.63 mg via RESPIRATORY_TRACT
  Filled 2016-01-06: qty 3

## 2016-01-06 MED ORDER — GLUCERNA SHAKE PO LIQD
237.0000 mL | Freq: Two times a day (BID) | ORAL | Status: DC
  Administered 2016-01-06: 237 mL via ORAL

## 2016-01-06 MED ORDER — PIPERACILLIN-TAZOBACTAM IN DEX 2-0.25 GM/50ML IV SOLN
2.2500 g | Freq: Three times a day (TID) | INTRAVENOUS | Status: DC
  Administered 2016-01-06: 2.25 g via INTRAVENOUS
  Filled 2016-01-06 (×3): qty 50

## 2016-01-06 NOTE — Consult Note (Signed)
WOC wound consult note Reason for Consult: Right foot, 5th digit full thickness ulcer. Condition is outside the scope of [facility] practice but assessment and recommendation is below. Wound type: Neuropathic vs infectious vs trauma Pressure Ulcer POA: No Measurement: Entire 5th digit and 3cm x 7cm of lateral and anterior foot presents with erythema and warmth.  Digit is additionally edematous with intact blister and yellow/white discoloration, which is often an indication of circulatory compromise. See recommendations below. Wound bed:As described above Drainage (amount, consistency, odor) Serous in a small amount, faint, must odor from right foot Periwound: As described above Dressing procedure/placement/frequency: I suggest consultation with orthopedics for examination and recommendations pertaining to digit salvage. If you agree, please order/arrange.  In the meantime, I will provide Nursing with conservative orders for topical care. [place] nursing team will not follow, but will remain available to this patient, the nursing and medical teams.  Please re-consult if needed. Thanks, Maudie Flakes, MSN, RN, [place], Arther Abbott  Pager# [phone]

## 2016-01-06 NOTE — Progress Notes (Signed)
Patient has continued to c/o SOB since this morning. O2 sats have been mid 90's on RA since 0920. Scheduled duoneb received at 1018. At 15 patient's spouse notified RN that patient was c/o SOB again. O2 sats 86% on RA at this time. Expiratory wheezing audible bilaterally. Patient repositioned in bed and O2 sats increased to 88% on RA. Patient placed on 2L O2 n/c and O2 sats increased to 93%. Dr. Posey Pronto notified of all of the above. New orders received, including orders for transfer to stepdown. Will continue to monitor.  Joellen Jersey, RN.

## 2016-01-06 NOTE — Progress Notes (Signed)
Pharmacy Antibiotic Note  [name] is a 82 y.o. male admitted on 12/20/2015 with cellulitis.  Pharmacy has been consulted for vancomycin and zosyn dosing. WBC 16.6, ANC 14.3, creat 3.96, lactate WNL.  AF. Pt with ESRD not on HD (pt declines) , DM2, 1st digit red toe.  Xray negative for osteo.   vanc 1 gm given in ED 6/24 at 2117  Plan: give a 2nd dose of vancomycin 1 gm to make total loading dose 2 gm, next dose due ~ 4-5 days Zosyn 2.25 IV q8h Check vanc random Thursday am and redose when < = 15 mcg/ml    Temp (24hrs), Avg:98.3 F (36.8 C), Min:98 F (36.7 C), Max:98.6 F (37 C)   Recent Labs Lab 01/08/2016 2010 01/09/2016 2020  WBC 16.6*  --   CREATININE 3.96*  --   LATICACIDVEN  --  0.71    Estimated Creatinine Clearance: 15.3 mL/min (by C-G formula based on Cr of 3.96).    No Known Allergies Thank you for allowing pharmacy to be a part of this patient's care.  Eudelia Bunch, Pharm.D. [phone] 01/06/2016 2:35 AM

## 2016-01-06 NOTE — Progress Notes (Signed)
  Pharmacy Antibiotic Note  [name] is a 82 y.o. male admitted on 01/06/2016 with cellulitis.  X-ray neg for osteo. Pharmacy has been consulted for vancomycin/cefepime/flagyl dosing. Originally started on vanc and zosyn. WBC 16.4, ANC 14.3   Plan: Discontinue Zosyn  Check vanc random Thursday am and redose when < = 15 mcg/ml  Start cefepime '500mg'$  IV Q24 hours  Start flagyl '500mg'$  IV Q8 hours  F/U renal fxn, LOT, cultures     Temp (24hrs), Avg:98.1 F (36.7 C), Min:97.3 F (36.3 C), Max:98.6 F (37 C)   Recent Labs Lab 01/06/2016 2010 12/21/2015 2020 01/06/16 0506 01/06/16 0857  WBC 16.6*  --  16.4*  --   CREATININE 3.96*  --  4.04*  --   LATICACIDVEN  --  0.71 0.6 0.7    Estimated Creatinine Clearance: 15 mL/min (by C-G formula based on Cr of 4.04).    No Known Allergies  Antimicrobials this admission: 6/25 Vanc>>      vanc 1 gm given in ED 6/24 at 2117 - another 1g given to complete load  6/25 Zosyn x 1  6/25 Cefepime>>  6/25 metronidazole>>  Microbiology results: 6/24 MRSA PCR>>sent  6/24 BCx x2>>sent  Thank you for allowing pharmacy to be a part of this patient's care.  Quamaine Webb C. Lennox Grumbles, PharmD Pharmacy Resident  Pager: [phone] 01/06/2016 10:37 AM

## 2016-01-06 NOTE — Progress Notes (Signed)
RN has been notified by patient's family that patient has been very restless and having a hard time breathing. RN assessed patient and found patient tossing in bed, trying to get comfortable. Patient using accessory muscles to breathe. RN placed head of bed up but patient requests to lay flat. RN checked o2 saturation, patient ranged from 86-92% on room air. Patient's family requested for oxygen to help patient breathe better. RN administered Lasix '40mg'$  and Ambien to patient. RN paged on call NP, Rogue Bussing. On call NP placed order for a breathing treatment. RN notified RT. RT at bedside, gave patient treatment but no improvement. RT recommended that patient needs oxygen and she will run patient's status by rapid response RN, Jerene Pitch.  RN received order for oxygen from on call NP. Patient placed on 4L of oxygen. Rapid at beside, assessed patient. Chest x-ray STAT ordered. Patient voided 250 at 0200. Bladder scanned completed, 44m retained. On call NP updated.  On call NP placed order for 60 of lasix.   RN spoke with on call NP, patient voided 475 at 544. Patient is resting and no in distress. RN updated rapid response. RN will continue to monitor patient.   MErmalinda Memos RN

## 2016-01-06 NOTE — Progress Notes (Signed)
Asked to see Patient per RT at 0200 regarding wheezing and crackles in bilateral upper lobes. NEB treatment given at 0130 with no improvement. Per floor RN since arrival to floor at 2215 He has been progressively anxious with worsening SOB, Ambien 5 mg PO given at [place]. Upon my arrival at 0200 Pt found resting in bed eyes closed, no distress. Audible wheezing and crackles heard with and  without stethoscope, RR 17-22. Po2 94-96% on 4 LNC. Pt using accessory muscles and ABD with respirations. HR 100s BP 131/62.  Pt awakens to voice, able to follow simple commands, oriented to self only at this time. Pt given lasix 40 mg at 0007 and voided 250 ml urine at 0200. Bladder scan done revealing less than 20 ml in bladder. CXR completed STAT and [facility] NP Kathline Magic paged to update. 60 mg lasix ordered and given  IVP. Floor RN to update provider within an hour if no improvement.

## 2016-01-06 NOTE — Progress Notes (Signed)
Echocardiogram 2D Echocardiogram has been performed.  [name] 01/06/2016, 3:23 PM

## 2016-01-06 NOTE — Progress Notes (Signed)
New Admission Note: 01/01/2016  Arrival Method: stretcher Mental Orientation:alert and oriented x 3 Telemetry: Box [id number], 2nd verification completed Assessment: completed Skin: 2 RNs checked. Bruising to bilateral upper extremities. Right foot small toe wound yellow in color with redness and swelling noted.  IV: RFA with IV fluids infusing Pain: Denies  Safety Measures: Bed in low position, bed alarm on. Call light within reach. Bed alarm on for high fall risk. Admission: Completed [facility] Orientation: Patient has been orientated to the room, unit and staff.  Family: 2 daughters at bedside.

## 2016-01-06 NOTE — Evaluation (Signed)
Clinical/Bedside Swallow Evaluation Patient Details  Name: [name] MRN: [id number] Date of Birth: 1927/11/13  Today's Date: 01/06/2016 Time: SLP Start Time (ACUTE ONLY): 1628 SLP Stop Time (ACUTE ONLY): 1638 SLP Time Calculation (min) (ACUTE ONLY): 10 min  Past Medical History:  Past Medical History  Diagnosis Date  . Diabetes mellitus   . Hypertension   . Hypercholesteremia   . Stroke ([place])   . PNA (pneumonia)   . Detached retina, right   . Cancer ([place])     lung ca dx'd 2008   Past Surgical History:  Past Surgical History  Procedure Laterality Date  . Appendectomy    . Cataract extraction     HPI:  Pt is an 82 y.o. male admitted to ED 6/24, PMH ESRD not on HD, Dm2, w c/o 1st digit red toe.Pt was noted to have SOB and wheezing, CXR showed vascular congestion, mild bibasilar opacities on 6/24; on 6/25 increased interstitial markings noted raising concern for pulmonary edema. Bedside swallow eval ordered to assess swallow function/ assist in ruling out aspiration.    Assessment / Plan / Recommendation Clinical Impression  Pt demonstrating a functional oropharyngeal swallow with no overt s/s of aspiration. Respiratory status does put pt at an increased risk of aspiration at this time; however, no difficulties observed during evaluation. Recommend initiating regular diet, thin liquids, meds whole with liquid, intermittent supervision to cue pt to have small bites/ sips. Reviewed precautions with pt especially taking time during meals due to respiratory issues. Pt and family in agreement with recommendations. SLP will sign off at this time. Please re-consult if needs arise.    Aspiration Risk  Mild aspiration risk    Diet Recommendation Regular;Thin liquid   Liquid Administration via: Cup;Straw Medication Administration: Whole meds with liquid Supervision: Patient able to self feed;Intermittent supervision to cue for compensatory strategies Compensations: Slow rate;Small  sips/bites Postural Changes: Seated upright at 90 degrees    Other  Recommendations Oral Care Recommendations: Oral care BID   Follow up Recommendations  None    Frequency and Duration            Prognosis        Swallow Study   General HPI: Pt is an 82 y.o. male admitted to ED 6/24, PMH ESRD not on HD, Dm2, w c/o 1st digit red toe.Pt was noted to have SOB and wheezing, CXR showed vascular congestion, mild bibasilar opacities on 6/24; on 6/25 increased interstitial markings noted raising concern for pulmonary edema. Bedside swallow eval ordered to assess swallow function/ assist in ruling out aspiration.  Type of Study: Bedside Swallow Evaluation Diet Prior to this Study: NPO Temperature Spikes Noted: No Respiratory Status: Nasal cannula History of Recent Intubation: No Behavior/Cognition: Alert;Cooperative;Pleasant mood Oral Cavity Assessment: Within Functional Limits Oral Cavity - Dentition: Adequate natural dentition Vision: Functional for self-feeding Self-Feeding Abilities: Able to feed self Patient Positioning: Upright in bed Baseline Vocal Quality: Normal Volitional Cough: Strong Volitional Swallow: Able to elicit    Oral/Motor/Sensory Function Overall Oral Motor/Sensory Function: Within functional limits   Ice Chips Ice chips: Not tested   Thin Liquid Thin Liquid: Within functional limits Presentation: Cup;Straw    Nectar Thick Nectar Thick Liquid: Not tested   Honey Thick Honey Thick Liquid: Not tested   Puree Puree: Within functional limits Presentation: Self Fed;Spoon   Solid   GO   Solid: Within functional limits Presentation: Self Patton Salles, Amy K, MA, CCC-SLP 01/06/2016,4:42 PM  [id number]

## 2016-01-06 NOTE — Progress Notes (Signed)
[facility] Progress Note  Patient: [name] WRU:[id number]   PCP: Mayra Neer, MD DOB: Nov 12, 1927   DOA: 01/11/2016   DOS: 01/06/2016   Date of Service: the patient was seen and examined on 01/06/2016  Subjective: The patient has been having intermittent episodes of respiratory distress with audible wheezing improving with nebulizer. Denies any chest pain or abdominal pain. Family isn't present to the hospital was right leg redness and ulcer. Denies any chest pain or abdominal pain. Nausea and vomiting. Has occasional cough. Shortness of breath has been progressively worsening over last few weeks Nutrition: Refusing to eat, concerning for aspiration at present  Brief hospital course: Pt. with PMH of CVA, DM, HTN, CKD IV, refusing dialysis; admitted on 12/14/2015, with complaint of right leg redness as well as foot ulcer, was found to have suspected right leg diabetic foot infection along with anemia. Patient was transfused 1 unit of PRBC and without shortness of breath as well as bilateral wheezing, minimal urine output with 100 mg of Lasix. Currently further plan is transfer to step down, continue respiratory support, continue antibiotics.  Assessment and Plan: 1. Type 2 diabetes mellitus with right diabetic foot infection ([place]) Presents with right leg redness as well as right fifth toe ulcer and infection. Leukocytosis on admission with anemia, ESR 111, CRP 19.1. No Lactic acidosis, blood culture no growth for 24 hours. MRSA PCR negative. Patient was initially started on vancomycin and Zosyn. Currently I would utilize diabetic foot order set with cefepime and Flagyl and vancomycin. If blood culture remains negative for 48 hours we will discontinue vancomycin. Orthopedics consulted and will follow-up on the patient. Vascular ABI ordered. MRI will not be able to be completed until Tuesday as the patient has chemotherapy seeds not compatible with MRI available over the  weekend. We will monitor.  2. Acute respiratory distress. Transfusion associated volume overload versus transmission associated lung injury. Patient has progressive shortness of breath but developed significant shortness of breath after receiving blood transfusion. Bilateral expiratory wheezing on my exam. Results with DuoNeb's. ABG shows mild hypoxia unchanged from prior. Received 100 mg of Lasix overnight without any significant urine output. I discussed with nephrology recommended to use 120 mg of IV Lasix and monitor for response. We will transfer the patient to step down unit for close monitoring. Will use BiPAP when necessary. Continue duo nebs every 4 hours. Avoiding steroids in the setting of ongoing active infection as well as diabetes mellitus.  3. Chronic kidney disease stage IV. Patient has refused hemodialysis in the past and continues to do so. Nephrology is aware of the patient. We'll formally consult if no improvement in patient's urine output. Check bladder scan.  4. Type 2 diabetes mellitus. Uncontrolled Hemoglobin A1c 8.1 in May 2017. Blood sugars appears to be well controlled at present. We will continue sliding scale insulin. While the patient remains nothing by mouth holding long-acting insulin.  5. Concern for aspiration. Patient has episodic respiratory distress which is concerning for aspiration. Patient will remain nothing by mouth we will get speech therapy consultation.  6. Anemia of chronic disease. Patient presented with hemoglobin of 7.3, recently discharged with hemoglobin of 9.6. Patient received one unit of PRBC transfusion and hemoglobin rise to appropriately to 8.4. Developed respiratory distress probably from volume overload. Transfusion reaction for TRALI he is also initiated. Check FOBT. Since the family refuses any active bleeding at home and the patient has a high risk for developing DVT I will continue with 81 mg aspirin  as well as  pharmacological DVT prophylaxis. Recheck CBC every 12 hours and if there is a drop discontinue the same.  7. Suspected acute on chronic diastolic dysfunction. Patient requiring significant respiratory support. We'll check echocardiogram.  Pain management: When necessary Tylenol Activity: Consulted physical therapy Bowel regimen: last BM 12/17/2015 Diet: Nothing by mouth DVT Prophylaxis: subcutaneous Heparin  Advance goals of care discussion: DNR/DNI  Family Communication: family was present at bedside, at the time of interview. The pt provided permission to discuss medical plan with the family. Opportunity was given to ask question and all questions were answered satisfactorily.   Disposition:  Discharge to SNF probably. Expected discharge date: 01/09/2016, improvement in respiratory distress as well as final disposition regarding the right toe infection  Consultants: Orthopedics Procedures: Echocardiogram, ABI  Antibiotics: Anti-infectives    Start     Dose/Rate Route Frequency Ordered Stop   01/06/16 1200  ceFEPIme (MAXIPIME) 500 mg in dextrose 5 % 50 mL IVPB     500 mg 100 mL/hr over 30 Minutes Intravenous Every 24 hours 01/06/16 1035     01/06/16 1030  metroNIDAZOLE (FLAGYL) IVPB 500 mg     500 mg 100 mL/hr over 60 Minutes Intravenous Every 8 hours 01/06/16 0943     01/06/16 0400  piperacillin-tazobactam (ZOSYN) IVPB 2.25 g  Status:  Discontinued     2.25 g 100 mL/hr over 30 Minutes Intravenous Every 8 hours 01/06/16 0227 01/06/16 1025   01/06/16 0330  vancomycin (VANCOCIN) IVPB 1000 mg/200 mL premix     1,000 mg 200 mL/hr over 60 Minutes Intravenous  Once 01/06/16 0236 01/06/16 0545   12/28/2015 2100  vancomycin (VANCOCIN) IVPB 1000 mg/200 mL premix     1,000 mg 200 mL/hr over 60 Minutes Intravenous  Once 12/19/2015 2051 12/29/2015 2217        Intake/Output Summary (Last 24 hours) at 01/06/16 1552 Last data filed at 01/06/16 1416  Gross per 24 hour  Intake   1332 ml   Output   1325 ml  Net      7 ml   There were no vitals filed for this visit.  Objective: Physical Exam: Filed Vitals:   01/06/16 1116 01/06/16 1300 01/06/16 1415 01/06/16 1420  BP:      Pulse:      Temp:      TempSrc:      Resp:      SpO2: 96% 95% 86% 93%    General: Alert, Awake and Oriented to Time, Place and Person. Appear in moderate distress Eyes: Conjunctiva normal ENT: Oral Mucosa clear moist. Neck: difficult to assess JVD, no Abnormal Mass Or lumps Cardiovascular: S1 and S2 Present, aortic systolic Murmur, Respiratory: Bilateral Air entry equal and Decreased, basal Crackles, bilateral wheezes Abdomen: Bowel Sound present, Soft and no tenderness Skin: no redness, no Rash  Extremities: right Pedal edema, no calf tenderness Neurologic: Grossly no focal neuro deficit. Bilaterally Equal motor strength  Data Reviewed: CBC:  Recent Labs Lab 01/04/2016 2010 01/06/16 0506  WBC 16.6* 16.4*  NEUTROABS 14.3*  --   HGB 7.3* 8.4*  HCT 22.8* 25.8*  MCV 98.3 95.2  PLT 223 128   Basic Metabolic Panel:  Recent Labs Lab 12/25/2015 2010 01/06/16 0506  NA 127* 130*  K 4.4 4.4  CL 103 106  CO2 13* 14*  GLUCOSE 315* 202*  BUN 54* 56*  CREATININE 3.96* 4.04*  CALCIUM 7.9* 8.0*    Liver Function Tests:  Recent Labs Lab 01/08/2016 2010 01/06/16 7867  AST 15 15  ALT 13* 14*  ALKPHOS 52 55  BILITOT 0.5 0.9  PROT 6.0* 6.4*  ALBUMIN 2.8* 2.7*    Recent Labs Lab 12/23/2015 2010  LIPASE 31   No results for input(s): AMMONIA in the last 168 hours. Coagulation Profile: No results for input(s): INR, PROTIME in the last 168 hours. Cardiac Enzymes: No results for input(s): CKTOTAL, CKMB, CKMBINDEX, TROPONINI in the last 168 hours. BNP (last 3 results) No results for input(s): PROBNP in the last 8760 hours.  CBG:  Recent Labs Lab 01/04/2016 1954 01/01/2016 2208 01/06/16 0726 01/06/16 1211  GLUCAP 315* 181* 171* 139*    Studies: Dg Chest 2 View  12/29/2015   CLINICAL DATA:  Acute onset of right foot pain and swelling. Fever. Recent fall. Initial encounter. EXAM: CHEST  2 VIEW COMPARISON:  Chest radiograph performed 10/25/2013, and CT of the chest performed 06/25/2015 FINDINGS: The lungs are well-aerated. Vascular congestion is noted. Mild bibasilar opacities may reflect atelectasis or possibly mild infection. Postoperative change is noted at the right midlung. No pleural effusion or pneumothorax is seen. The heart is normal in size; the mediastinal contour is within normal limits. No acute osseous abnormalities are seen. IMPRESSION: Vascular congestion noted. Mild bibasilar opacities may reflect atelectasis or possibly mild infection. Electronically Signed   By: Garald Balding M.D.   On: 12/14/2015 20:44   Dg Chest Port 1 View  01/06/2016  CLINICAL DATA:  Acute onset of shortness of breath. Initial encounter. EXAM: PORTABLE CHEST 1 VIEW COMPARISON:  Chest radiograph performed 12/18/2015 FINDINGS: There is elevation of the left hemidiaphragm. Vascular congestion is noted. Increased interstitial markings raise concern for pulmonary edema. Postoperative change is noted at the right midlung. No definite pleural effusion or pneumothorax is seen, though the right lung base is incompletely imaged on this study. The cardiomediastinal silhouette is borderline normal in size. No acute osseous abnormalities are seen. IMPRESSION: Elevation of the left hemidiaphragm. Vascular congestion noted. Increased interstitial markings raise concern for pulmonary edema. Electronically Signed   By: Garald Balding M.D.   On: 01/06/2016 02:57   Dg Foot Complete Right  01/04/2016  CLINICAL DATA:  Acute onset of right foot swelling and erythema. Fever. Recent fall. Initial encounter. EXAM: RIGHT FOOT COMPLETE - 3+ VIEW COMPARISON:  None. FINDINGS: There is no evidence of fracture or dislocation. No osseous erosions are identified. The joint spaces are preserved. There is no evidence of talar  subluxation; the subtalar joint is unremarkable in appearance. A plantar calcaneal spur is noted. A small os peroneum is seen. Diffuse vascular calcifications are seen. A soft tissue laceration is noted along the lateral aspect of the fifth toe, with diffuse soft tissue swelling about the lateral forefoot. IMPRESSION: 1. No evidence of fracture or dislocation. No osseous erosions seen. 2. Soft tissue laceration along the lateral aspect of the fifth toe, with diffuse soft tissue swelling about the lateral forefoot. 3. Diffuse vascular calcifications seen. Electronically Signed   By: Garald Balding M.D.   On: 12/26/2015 20:46     Scheduled Meds: . antiseptic oral rinse  7 mL Mouth Rinse BID  . aspirin EC  81 mg Oral Daily  . carvedilol  3.125 mg Oral BID WC  . ceFEPime (MAXIPIME) IV  500 mg Intravenous Q24H  . feeding supplement (GLUCERNA SHAKE)  237 mL Oral BID BM  . furosemide  120 mg Intravenous Once  . heparin  5,000 Units Subcutaneous Q8H  . hydrALAZINE  25 mg Oral Q8H  .  insulin aspart  0-5 Units Subcutaneous QHS  . insulin aspart  0-9 Units Subcutaneous TID WC  . ipratropium-albuterol  3 mL Nebulization Q4H  . isosorbide mononitrate  30 mg Oral Daily  . metronidazole  500 mg Intravenous Q8H  . sodium chloride flush  3 mL Intravenous Q12H   Continuous Infusions:  PRN Meds: acetaminophen **OR** acetaminophen  Time spent: 30 minutes  Author: Berle Mull, MD [facility] Pager: [phone] 01/06/2016 3:52 PM  If 7PM-7AM, please contact night-coverage at [email or web address], password [facility]

## 2016-01-07 ENCOUNTER — Encounter (HOSPITAL_COMMUNITY): Payer: Medicare Other

## 2016-01-07 LAB — CBC WITH DIFFERENTIAL/PLATELET
Basophils Absolute: 0 10*3/uL (ref 0.0–0.1)
Basophils Relative: 0 %
EOS PCT: 0 %
Eosinophils Absolute: 0 10*3/uL (ref 0.0–0.7)
HEMATOCRIT: 26.3 % — AB (ref 39.0–52.0)
Hemoglobin: 8.9 g/dL — ABNORMAL LOW (ref 13.0–17.0)
LYMPHS ABS: 1 10*3/uL (ref 0.7–4.0)
LYMPHS PCT: 6 %
MCH: 32.2 pg (ref 26.0–34.0)
MCHC: 33.8 g/dL (ref 30.0–36.0)
MCV: 95.3 fL (ref 78.0–100.0)
MONO ABS: 1.8 10*3/uL — AB (ref 0.1–1.0)
MONOS PCT: 11 %
NEUTROS ABS: 13.5 10*3/uL — AB (ref 1.7–7.7)
Neutrophils Relative %: 83 %
Platelets: 267 10*3/uL (ref 150–400)
RBC: 2.76 MIL/uL — ABNORMAL LOW (ref 4.22–5.81)
RDW: 16.6 % — AB (ref 11.5–15.5)
WBC: 16.3 10*3/uL — ABNORMAL HIGH (ref 4.0–10.5)

## 2016-01-07 LAB — COMPREHENSIVE METABOLIC PANEL
ALT: 17 U/L (ref 17–63)
ANION GAP: 11 (ref 5–15)
AST: 24 U/L (ref 15–41)
Albumin: 2.9 g/dL — ABNORMAL LOW (ref 3.5–5.0)
Alkaline Phosphatase: 65 U/L (ref 38–126)
BILIRUBIN TOTAL: 0.9 mg/dL (ref 0.3–1.2)
BUN: 60 mg/dL — AB (ref 6–20)
CO2: 16 mmol/L — ABNORMAL LOW (ref 22–32)
Calcium: 8.3 mg/dL — ABNORMAL LOW (ref 8.9–10.3)
Chloride: 104 mmol/L (ref 101–111)
Creatinine, Ser: 4.5 mg/dL — ABNORMAL HIGH (ref 0.61–1.24)
GFR, EST AFRICAN AMERICAN: 12 mL/min — AB (ref >60)
GFR, EST NON AFRICAN AMERICAN: 11 mL/min — AB (ref >60)
Glucose, Bld: 89 mg/dL (ref 65–99)
POTASSIUM: 3.8 mmol/L (ref 3.5–5.1)
Sodium: 131 mmol/L — ABNORMAL LOW (ref 135–145)
TOTAL PROTEIN: 6.8 g/dL (ref 6.5–8.1)

## 2016-01-07 LAB — PROTIME-INR
INR: 1.25 (ref 0.00–1.49)
PROTHROMBIN TIME: 15.8 s — AB (ref 11.6–15.2)

## 2016-01-07 LAB — HEMOGLOBIN A1C
HEMOGLOBIN A1C: 6 % — AB (ref 4.8–5.6)
Mean Plasma Glucose: 126 mg/dL

## 2016-01-07 LAB — GLUCOSE, CAPILLARY
GLUCOSE-CAPILLARY: 98 mg/dL (ref 65–99)
GLUCOSE-CAPILLARY: 130 mg/dL — AB (ref 65–99)
Glucose-Capillary: 138 mg/dL — ABNORMAL HIGH (ref 65–99)
Glucose-Capillary: 139 mg/dL — ABNORMAL HIGH (ref 65–99)

## 2016-01-07 LAB — HIV ANTIBODY (ROUTINE TESTING W REFLEX): HIV Screen 4th Generation wRfx: NONREACTIVE

## 2016-01-07 LAB — TROPONIN I: TROPONIN I: 0.25 ng/mL — AB (ref <0.031)

## 2016-01-07 LAB — MAGNESIUM: MAGNESIUM: 1.6 mg/dL — AB (ref 1.7–2.4)

## 2016-01-07 MED ORDER — QUETIAPINE FUMARATE 25 MG PO TABS
25.0000 mg | ORAL_TABLET | Freq: Every day | ORAL | Status: DC
  Administered 2016-01-07: 25 mg via ORAL
  Filled 2016-01-07: qty 1

## 2016-01-07 MED ORDER — BOOST / RESOURCE BREEZE PO LIQD
1.0000 | Freq: Three times a day (TID) | ORAL | Status: DC
  Administered 2016-01-07: 1 via ORAL

## 2016-01-07 MED ORDER — HALOPERIDOL LACTATE 5 MG/ML IJ SOLN
1.0000 mg | Freq: Four times a day (QID) | INTRAMUSCULAR | Status: DC | PRN
  Administered 2016-01-07 – 2016-01-08 (×3): 1 mg via INTRAVENOUS
  Filled 2016-01-07 (×4): qty 1

## 2016-01-07 MED ORDER — LORAZEPAM 2 MG/ML IJ SOLN
0.5000 mg | Freq: Once | INTRAMUSCULAR | Status: AC
  Administered 2016-01-07: 0.5 mg via INTRAVENOUS
  Filled 2016-01-07: qty 1

## 2016-01-07 MED ORDER — FUROSEMIDE 10 MG/ML IJ SOLN
120.0000 mg | Freq: Once | INTRAVENOUS | Status: AC
  Administered 2016-01-07: 120 mg via INTRAVENOUS
  Filled 2016-01-07: qty 12

## 2016-01-07 MED ORDER — GLYCOPYRROLATE 1 MG PO TABS
1.0000 mg | ORAL_TABLET | Freq: Three times a day (TID) | ORAL | Status: DC | PRN
  Filled 2016-01-07: qty 1

## 2016-01-07 MED ORDER — MAGNESIUM SULFATE 2 GM/50ML IV SOLN
2.0000 g | Freq: Once | INTRAVENOUS | Status: AC
  Administered 2016-01-07: 2 g via INTRAVENOUS
  Filled 2016-01-07: qty 50

## 2016-01-07 NOTE — Care Management Important Message (Signed)
Important Message  Patient Details  Name: [name] MRN: [id number] Date of Birth: 04/14/1928   Medicare Important Message Given:  Yes    [name] 01/07/2016, 10:26 AM

## 2016-01-07 NOTE — Progress Notes (Signed)
[facility] Progress Note  Patient: [name] ERX:[id number]   PCP: Mayra Neer, MD DOB: 04/08/1928   DOA: 01/04/2016   DOS: 01/07/2016   Date of Service: the patient was seen and examined on 01/07/2016  Subjective: Patient has been agitated overnight. Complains of sore throat of the mouth. Denies any chest pain or abdominal pain. Nutrition: Minimal oral intake but tolerating it well  Brief hospital course: Pt. with PMH of CVA, DM, HTN, CKD IV, refusing dialysis; admitted on 12/15/2015, with complaint of right leg redness as well as foot ulcer, was found to have suspected right leg diabetic foot infection along with anemia. Patient was transfused 1 unit of PRBC and without shortness of breath as well as bilateral wheezing, minimal urine output with 100 mg of Lasix. Currently further plan is continue respiratory support, continue antibiotics.  Assessment and Plan: 1. Type 2 diabetes mellitus with right diabetic foot infection ([place]) Presents with right leg redness as well as right fifth toe ulcer and infection. Leukocytosis on admission with anemia, ESR 111, CRP 19.1. No Lactic acidosis, blood culture no growth for 24 hours. MRSA PCR negative. Patient was initially started on vancomycin and Zosyn. Currently I would utilize diabetic foot order set with cefepime and Flagyl and vancomycin. If blood culture remains negative for 48 hours we will discontinue vancomycin. Orthopedics consulted and will follow-up on the patient. Vascular ABI ordered. MRI will not be able to be completed until Tuesday as the patient has chemotherapy seeds not compatible with MRI available over the weekend. We will monitor.  2. Acute respiratory distress. Transfusion associated volume overload versus transmission associated lung injury. Patient has progressive shortness of breath but developed significant shortness of breath after receiving blood transfusion. Bilateral expiratory wheezing on my  exam. Results with DuoNeb's. ABG shows mild hypoxia unchanged from prior. Getting a dose of IV Lasix.  Will use BiPAP when necessary. Continue duo nebs every 4 hours. Avoiding steroids in the setting of ongoing active infection as well as diabetes mellitus.  3. Chronic kidney disease stage IV. Patient has refused hemodialysis in the past and continues to do so. Nephrology is aware of the patient.  4. Type 2 diabetes mellitus. Uncontrolled Hemoglobin A1c 8.1 in May 2017. Blood sugars appears to be well controlled at present. We will continue sliding scale insulin.  5. Concern for aspiration. Regular diet recommended by speech therapy..  6. Anemia of chronic disease. Patient presented with hemoglobin of 7.3, recently discharged with hemoglobin of 9.6. Patient received one unit of PRBC transfusion and hemoglobin rise to appropriately to 8.4. Developed respiratory distress probably from volume overload. Pathology lab recommend no evidence of transfusion reaction or TRALI. Check FOBT. Since the family refuses any active bleeding at home and the patient has a high risk for developing DVT I will continue with 81 mg aspirin as well as pharmacological DVT prophylaxis. Recheck CBC every 12 hours and if there is a drop discontinue the same.  7. Suspected acute on chronic diastolic dysfunction. Patient requiring significant respiratory support. Due to IV Lasix, echo 55-60% EF.  8. Agitation. Started the patient on IV hydralazine as needed. Added Seroquel.  9. Goals of care discussion. Patient's renal functions remain stable but mentation is worsening. Also has now probable osteomyelitis of the left fifth toe not amenable for surgery. With this the patient appears to be critically ill and family wants to continue with current care without any escalation.  Pain management: When necessary Tylenol Activity: Consulted physical therapy Bowel regimen:  last BM 12/21/2015 Diet: Renal diet DVT  Prophylaxis: subcutaneous Heparin  Advance goals of care discussion: DNR/DNI  Family Communication: family was present at bedside, at the time of interview. The pt provided permission to discuss medical plan with the family. Opportunity was given to ask question and all questions were answered satisfactorily.   Disposition:  Discharge to SNF probably. Expected discharge date: 01/09/2016, improvement in respiratory distress as well as final disposition regarding the right toe infection  Consultants: Orthopedics Procedures: Echocardiogram, ABI  Antibiotics: Anti-infectives    Start     Dose/Rate Route Frequency Ordered Stop   01/06/16 1200  ceFEPIme (MAXIPIME) 500 mg in dextrose 5 % 50 mL IVPB     500 mg 100 mL/hr over 30 Minutes Intravenous Every 24 hours 01/06/16 1035     01/06/16 1030  metroNIDAZOLE (FLAGYL) IVPB 500 mg     500 mg 100 mL/hr over 60 Minutes Intravenous Every 8 hours 01/06/16 0943     01/06/16 0400  piperacillin-tazobactam (ZOSYN) IVPB 2.25 g  Status:  Discontinued     2.25 g 100 mL/hr over 30 Minutes Intravenous Every 8 hours 01/06/16 0227 01/06/16 1025   01/06/16 0330  vancomycin (VANCOCIN) IVPB 1000 mg/200 mL premix     1,000 mg 200 mL/hr over 60 Minutes Intravenous  Once 01/06/16 0236 01/06/16 0545   12/31/2015 2100  vancomycin (VANCOCIN) IVPB 1000 mg/200 mL premix     1,000 mg 200 mL/hr over 60 Minutes Intravenous  Once 01/08/2016 2051 12/16/2015 2217        Intake/Output Summary (Last 24 hours) at 01/07/16 1928 Last data filed at 01/07/16 1700  Gross per 24 hour  Intake    283 ml  Output   1550 ml  Net  -1267 ml   Filed Weights   01/06/16 1530  Weight: 83.598 kg (184 lb 4.8 oz)    Objective: Physical Exam: Filed Vitals:   01/07/16 1323 01/07/16 1520 01/07/16 1700 01/07/16 1723  BP: 136/67  147/73 147/73  Pulse: 102   102  Temp: 96.4 F (35.8 C)  97.8 F (36.6 C)   TempSrc: Oral  Tympanic   Resp: 18  16   Height:      Weight:      SpO2: 96%  95% 96%     General: Alert, Awake and Oriented to Time, Place and Person. Appear in moderate distress Eyes: Conjunctiva normal ENT: Oral Mucosa clear moist. Neck: difficult to assess JVD, no Abnormal Mass Or lumps Cardiovascular: S1 and S2 Present, aortic systolic Murmur, Respiratory: Bilateral Air entry equal and Decreased, basal Crackles, bilateral wheezes Abdomen: Bowel Sound present, Soft and no tenderness Skin: no redness, no Rash  Extremities: right Pedal edema, no calf tenderness Neurologic: Grossly no focal neuro deficit. Bilaterally Equal motor strength  Data Reviewed: CBC:  Recent Labs Lab 12/31/2015 2010 01/06/16 0506 01/07/16 0250  WBC 16.6* 16.4* 16.3*  NEUTROABS 14.3*  --  13.5*  HGB 7.3* 8.4* 8.9*  HCT 22.8* 25.8* 26.3*  MCV 98.3 95.2 95.3  PLT 223 222 188   Basic Metabolic Panel:  Recent Labs Lab 01/04/2016 2010 01/06/16 0506 01/07/16 0250  NA 127* 130* 131*  K 4.4 4.4 3.8  CL 103 106 104  CO2 13* 14* 16*  GLUCOSE 315* 202* 89  BUN 54* 56* 60*  CREATININE 3.96* 4.04* 4.50*  CALCIUM 7.9* 8.0* 8.3*  MG  --   --  1.6*    Liver Function Tests:  Recent Labs Lab 12/13/2015 2010 01/06/16  7026 01/07/16 0250  AST _0 ALT 13* 14* 17  ALKPHOS 52 55 65  BILITOT 0.5 0.9 0.9  PROT 6.0* 6.4* 6.8  ALBUMIN 2.8* 2.7* 2.9*    Recent Labs Lab 01/11/2016 2010  LIPASE 31   No results for input(s): AMMONIA in the last 168 hours. Coagulation Profile:  Recent Labs Lab 01/07/16 0250  INR 1.25   Cardiac Enzymes:  Recent Labs Lab 01/07/16 0250  TROPONINI 0.25*   BNP (last 3 results) No results for input(s): PROBNP in the last 8760 hours.  CBG:  Recent Labs Lab 01/06/16 1656 01/06/16 2124 01/07/16 0857 01/07/16 1327 01/07/16 1722  GLUCAP 76 137* 98 130* 138*    Studies: No results found.   Scheduled Meds: . antiseptic oral rinse  7 mL Mouth Rinse BID  . aspirin EC  81 mg Oral Daily  . carvedilol  3.125 mg Oral BID WC  . ceFEPime  (MAXIPIME) IV  500 mg Intravenous Q24H  . feeding supplement  1 Container Oral TID WC  . heparin  5,000 Units Subcutaneous Q8H  . hydrALAZINE  25 mg Oral Q8H  . insulin aspart  0-5 Units Subcutaneous QHS  . insulin aspart  0-9 Units Subcutaneous TID WC  . ipratropium-albuterol  3 mL Nebulization Q4H  . isosorbide mononitrate  30 mg Oral Daily  . metronidazole  500 mg Intravenous Q8H  . QUEtiapine  25 mg Oral QHS  . sodium chloride flush  3 mL Intravenous Q12H   Continuous Infusions:  PRN Meds: acetaminophen **OR** acetaminophen, glycopyrrolate, haloperidol lactate  Time spent: 30 minutes  Author: Berle Mull, MD [facility] Pager: [phone] 01/07/2016 7:28 PM  If 7PM-7AM, please contact night-coverage at [email or web address], password [facility]

## 2016-01-07 NOTE — Progress Notes (Signed)
Daughter and wife at bedside refusing to get MRI done tonight as pt is agitated and has not been able to rest well. Will do it in the morning.

## 2016-01-07 NOTE — Progress Notes (Signed)
Initial Nutrition Assessment  DOCUMENTATION CODES:   Not applicable  INTERVENTION:   Boost Breeze po TID, each supplement provides 250 kcal and 9 grams of protein  NUTRITION DIAGNOSIS:   Increased nutrient needs related to wound healing as evidenced by estimated needs  GOAL:   Patient will meet greater than or equal to 90% of their needs  MONITOR:   PO intake, Supplement acceptance, Labs, Weight trends, Skin, I & O's  REASON FOR ASSESSMENT:   Consult Wound healing  ASSESSMENT:   82 yo Male with PMH of CVA, DM, HTN, CKD IV, refusing dialysis; admitted on 01/04/2016, with complaint of right leg redness as well as foot ulcer, was found to have suspected right leg diabetic foot infection along with anemia. Patient was transfused 1 unit of PRBC and without shortness of breath as well as bilateral wheezing, minimal urine output with 100 mg of Lasix.  Spoke with patient's family at bedside >> pt confused. Report pt's appetite is very poor >> PO intake 5% per flowsheets. Glucerna Shakes and Ensure Enlive supplements give the patient diarrhea per wife. Pt's wife also reveals that pt has lost weight over the last couple months >> unsure of quantity. Per weight readings, pt has had a 6% weight loss since April 2017. Unable to complete Nutrition-Focused physical exam at this time.   Diet Order:  Diet heart healthy/carb modified Room service appropriate?: Yes; Fluid consistency:: Thin  Skin:  Wound (see comment) (diabetic toe ulcer)  Last BM:  6/25  Height:   Ht Readings from Last 1 Encounters:  01/06/16 '5\' 10"'$  (1.778 m)    Weight:   Wt Readings from Last 1 Encounters:  01/06/16 184 lb 4.8 oz (83.598 kg)    Wt Readings from Last 10 Encounters:  01/06/16 184 lb 4.8 oz (83.598 kg)  12/31/15 195 lb (88.451 kg)  12/24/15 194 lb (87.998 kg)  11/12/15 180 lb 12.4 oz (82 kg)  11/09/15 196 lb (88.905 kg)  07/02/15 195 lb (88.451 kg)  06/28/14 204 lb 9.6 oz (92.806 kg)   10/25/13 205 lb (92.987 kg)  06/29/13 207 lb 9.6 oz (94.167 kg)  06/30/12 214 lb 3.2 oz (97.16 kg)    Ideal Body Weight:  75.4 kg  BMI:  Body mass index is 26.44 kg/(m^2).  Estimated Nutritional Needs:   Kcal:  1800-2000  Protein:  90-100 gm  Fluid:  1.8-2.0 L  EDUCATION NEEDS:   No education needs identified at this time  Arthur Holms, RD, LDN Pager #: [phone] After-Hours Pager #: [phone]

## 2016-01-07 NOTE — Consult Note (Signed)
ORTHOPAEDIC CONSULTATION  REQUESTING PHYSICIAN: Lavina Hamman, MD  Chief Complaint: Infection right foot little toe  HPI: [name] is a 82 y.o. male who presents with cellulitis right foot with an infected little toe. Patient has diabetes with end-stage renal disease peripheral vascular disease and presents with acute cellulitis right foot  Past Medical History  Diagnosis Date  . Diabetes mellitus   . Hypertension   . Hypercholesteremia   . Stroke ([place])   . PNA (pneumonia)   . Detached retina, right   . Cancer ([place])     lung ca dx'd 2008   Past Surgical History  Procedure Laterality Date  . Appendectomy    . Cataract extraction     Social History   Social History  . Marital Status: Married    Spouse Name: N/A  . Number of Children: N/A  . Years of Education: N/A   Social History Main Topics  . Smoking status: Former Smoker -- 1.00 packs/day for 38 years    Quit date: 07/14/2004  . Smokeless tobacco: None  . Alcohol Use: No  . Drug Use: No  . Sexual Activity: Not Asked   Other Topics Concern  . None   Social History Narrative   Family History  Problem Relation Age of Onset  . Cancer Mother   . Cancer Brother    - negative except otherwise stated in the family history section No Known Allergies Prior to Admission medications   Medication Sig Start Date End Date Taking? Authorizing Provider  acetaminophen (TYLENOL) 500 MG tablet Take 1,000 mg by mouth every 8 (eight) hours as needed for moderate pain.   Yes Historical Provider, MD  amLODipine (NORVASC) 10 MG tablet Take 10 mg by mouth daily. 01/02/16  Yes Historical Provider, MD  aspirin 81 MG tablet Take 81 mg by mouth daily.   Yes Historical Provider, MD  B Complex-Folic Acid (SUPER B COMPLEX MAXI) TABS Take 1 tablet by mouth daily.   Yes Historical Provider, MD  Capsaicin-Menthol (SALONPAS GEL EX) Apply 1 application topically daily as needed (for pain).   Yes Historical Provider, MD    carvedilol (COREG) 3.125 MG tablet Take 1 tablet (3.125 mg total) by mouth 2 (two) times daily with a meal. 11/14/15  Yes Bonnell Public, MD  insulin lispro (HUMALOG) 100 UNIT/ML injection Inject 20 Units into the skin every evening. 20 units in evening only   Yes Historical Provider, MD  Liniments (SALONPAS PAIN RELIEF PATCH EX) Apply 1 patch topically daily as needed (for pain).   Yes Historical Provider, MD  amLODipine (NORVASC) 5 MG tablet Take 1 tablet (5 mg total) by mouth daily. 11/14/15   Bonnell Public, MD  insulin detemir (LEVEMIR) 100 UNIT/ML injection Inject 30 Units into the skin at bedtime. Reported on 07/02/2015    Historical Provider, MD   Dg Chest 2 View  12/17/2015  CLINICAL DATA:  Acute onset of right foot pain and swelling. Fever. Recent fall. Initial encounter. EXAM: CHEST  2 VIEW COMPARISON:  Chest radiograph performed 10/25/2013, and CT of the chest performed 06/25/2015 FINDINGS: The lungs are well-aerated. Vascular congestion is noted. Mild bibasilar opacities may reflect atelectasis or possibly mild infection. Postoperative change is noted at the right midlung. No pleural effusion or pneumothorax is seen. The heart is normal in size; the mediastinal contour is within normal limits. No acute osseous abnormalities are seen. IMPRESSION: Vascular congestion noted. Mild bibasilar opacities may reflect atelectasis or possibly mild infection.  Electronically Signed   By: Garald Balding M.D.   On: 12/29/2015 20:44   Dg Chest Port 1 View  01/06/2016  CLINICAL DATA:  Acute onset of shortness of breath. Initial encounter. EXAM: PORTABLE CHEST 1 VIEW COMPARISON:  Chest radiograph performed 01/11/2016 FINDINGS: There is elevation of the left hemidiaphragm. Vascular congestion is noted. Increased interstitial markings raise concern for pulmonary edema. Postoperative change is noted at the right midlung. No definite pleural effusion or pneumothorax is seen, though the right lung base is  incompletely imaged on this study. The cardiomediastinal silhouette is borderline normal in size. No acute osseous abnormalities are seen. IMPRESSION: Elevation of the left hemidiaphragm. Vascular congestion noted. Increased interstitial markings raise concern for pulmonary edema. Electronically Signed   By: Garald Balding M.D.   On: 01/06/2016 02:57   Dg Foot Complete Right  12/19/2015  CLINICAL DATA:  Acute onset of right foot swelling and erythema. Fever. Recent fall. Initial encounter. EXAM: RIGHT FOOT COMPLETE - 3+ VIEW COMPARISON:  None. FINDINGS: There is no evidence of fracture or dislocation. No osseous erosions are identified. The joint spaces are preserved. There is no evidence of talar subluxation; the subtalar joint is unremarkable in appearance. A plantar calcaneal spur is noted. A small os peroneum is seen. Diffuse vascular calcifications are seen. A soft tissue laceration is noted along the lateral aspect of the fifth toe, with diffuse soft tissue swelling about the lateral forefoot. IMPRESSION: 1. No evidence of fracture or dislocation. No osseous erosions seen. 2. Soft tissue laceration along the lateral aspect of the fifth toe, with diffuse soft tissue swelling about the lateral forefoot. 3. Diffuse vascular calcifications seen. Electronically Signed   By: Garald Balding M.D.   On: 01/06/2016 20:46   - pertinent xrays, CT, MRI studies were reviewed and independently interpreted  Positive ROS: All other systems have been reviewed and were otherwise negative with the exception of those mentioned in the HPI and as above.  Physical Exam: General: Alert, no acute distress Cardiovascular: No pedal edema Respiratory: No cyanosis, no use of accessory musculature GI: No organomegaly, abdomen is soft and non-tender Skin: Ulceration in the fourth web space along the right little toe. Patient has cellulitis involving the forefoot. Neurologic: Patient does not have protective  sensation. Psychiatric: Patient is competent for consent with normal mood and affect Lymphatic: No axillary or cervical lymphadenopathy  MUSCULOSKELETAL:  On examination patient's right lower extremity he has venous insufficiency he has a thready dorsalis pedis pulse. There is cellulitis of the forefoot and ulceration of the little toe in the webspace. This does not probe the bone. Patient's radiographs do not show any destructive bony changes they do show calcification of the vessels within the foot.  Assessment: Assessment: Diabetic insensate neuropathy with cellulitis ulceration right little toe with peripheral vascular disease and end-stage renal disease not on dialysis.  Plan: Plan: Would recommend continuing the IV antibiotics. I can follow-up as an outpatient. Patient is not a good surgical candidate with his diabetes, peripheral vascular disease and end-stage renal disease likelihood of healing surgical incision to the foot is very low.  Thank you for the consult and the opportunity to see Mr. Manpreet Strey, [place] [phone] 7:09 AM

## 2016-01-07 NOTE — Progress Notes (Signed)
Family wants Pt to rest(sleep) as he removed his Condom catheter. Care giver has wanted to put a condom catheter family refused until Pt wakes up

## 2016-01-08 ENCOUNTER — Inpatient Hospital Stay (HOSPITAL_COMMUNITY): Payer: Medicare Other

## 2016-01-08 DIAGNOSIS — E871 Hypo-osmolality and hyponatremia: Secondary | ICD-10-CM | POA: Diagnosis present

## 2016-01-08 DIAGNOSIS — Z515 Encounter for palliative care: Secondary | ICD-10-CM | POA: Insufficient documentation

## 2016-01-08 LAB — CBC WITH DIFFERENTIAL/PLATELET
BASOS ABS: 0 10*3/uL (ref 0.0–0.1)
BASOS PCT: 0 %
Basophils Absolute: 0 10*3/uL (ref 0.0–0.1)
Basophils Relative: 0 %
EOS ABS: 0 10*3/uL (ref 0.0–0.7)
EOS PCT: 0 %
Eosinophils Absolute: 0 10*3/uL (ref 0.0–0.7)
Eosinophils Relative: 0 %
HCT: 24.9 % — ABNORMAL LOW (ref 39.0–52.0)
HEMATOCRIT: 26.9 % — AB (ref 39.0–52.0)
HEMOGLOBIN: 8.6 g/dL — AB (ref 13.0–17.0)
Hemoglobin: 8.2 g/dL — ABNORMAL LOW (ref 13.0–17.0)
Lymphocytes Relative: 8 %
Lymphocytes Relative: 2 %
Lymphs Abs: 1.3 10*3/uL (ref 0.7–4.0)
Lymphs Abs: 0.3 10*3/uL — ABNORMAL LOW (ref 0.7–4.0)
MCH: 32 pg (ref 26.0–34.0)
MCH: 31.6 pg (ref 26.0–34.0)
MCHC: 32.9 g/dL (ref 30.0–36.0)
MCHC: 32 g/dL (ref 30.0–36.0)
MCV: 97.3 fL (ref 78.0–100.0)
MCV: 98.9 fL (ref 78.0–100.0)
MONOS PCT: 1 %
Monocytes Absolute: 0.7 10*3/uL (ref 0.1–1.0)
Monocytes Absolute: 0.1 10*3/uL (ref 0.1–1.0)
Monocytes Relative: 4 %
NEUTROS ABS: 16.6 10*3/uL — AB (ref 1.7–7.7)
NEUTROS PCT: 97 %
Neutro Abs: 13.9 10*3/uL — ABNORMAL HIGH (ref 1.7–7.7)
Neutrophils Relative %: 88 %
PLATELETS: 263 10*3/uL (ref 150–400)
Platelets: 310 10*3/uL (ref 150–400)
RBC: 2.56 MIL/uL — AB (ref 4.22–5.81)
RBC: 2.72 MIL/uL — ABNORMAL LOW (ref 4.22–5.81)
RDW: 16.4 % — ABNORMAL HIGH (ref 11.5–15.5)
RDW: 16.6 % — ABNORMAL HIGH (ref 11.5–15.5)
WBC: 16 10*3/uL — AB (ref 4.0–10.5)
WBC: 17 10*3/uL — ABNORMAL HIGH (ref 4.0–10.5)

## 2016-01-08 LAB — COMPREHENSIVE METABOLIC PANEL
ALBUMIN: 2.7 g/dL — AB (ref 3.5–5.0)
ALT: 22 U/L (ref 17–63)
ALT: 25 U/L (ref 17–63)
AST: 39 U/L (ref 15–41)
AST: 34 U/L (ref 15–41)
Albumin: 2.7 g/dL — ABNORMAL LOW (ref 3.5–5.0)
Alkaline Phosphatase: 53 U/L (ref 38–126)
Alkaline Phosphatase: 61 U/L (ref 38–126)
Anion gap: 18 — ABNORMAL HIGH (ref 5–15)
Anion gap: 21 — ABNORMAL HIGH (ref 5–15)
BUN: 83 mg/dL — ABNORMAL HIGH (ref 6–20)
BUN: 104 mg/dL — AB (ref 6–20)
CHLORIDE: 106 mmol/L (ref 101–111)
CHLORIDE: 105 mmol/L (ref 101–111)
CO2: 11 mmol/L — AB (ref 22–32)
CO2: 7 mmol/L — AB (ref 22–32)
CREATININE: 5.29 mg/dL — AB (ref 0.61–1.24)
CREATININE: 6.36 mg/dL — AB (ref 0.61–1.24)
Calcium: 8.3 mg/dL — ABNORMAL LOW (ref 8.9–10.3)
Calcium: 8.3 mg/dL — ABNORMAL LOW (ref 8.9–10.3)
GFR calc non Af Amer: 9 mL/min — ABNORMAL LOW (ref >60)
GFR calc non Af Amer: 7 mL/min — ABNORMAL LOW (ref >60)
GFR, EST AFRICAN AMERICAN: 10 mL/min — AB (ref >60)
GFR, EST AFRICAN AMERICAN: 8 mL/min — AB (ref >60)
Glucose, Bld: 165 mg/dL — ABNORMAL HIGH (ref 65–99)
Glucose, Bld: 343 mg/dL — ABNORMAL HIGH (ref 65–99)
Potassium: 4.7 mmol/L (ref 3.5–5.1)
Potassium: 5.7 mmol/L — ABNORMAL HIGH (ref 3.5–5.1)
SODIUM: 135 mmol/L (ref 135–145)
Sodium: 133 mmol/L — ABNORMAL LOW (ref 135–145)
Total Bilirubin: 0.7 mg/dL (ref 0.3–1.2)
Total Bilirubin: 1.1 mg/dL (ref 0.3–1.2)
Total Protein: 6.4 g/dL — ABNORMAL LOW (ref 6.5–8.1)
Total Protein: 6.4 g/dL — ABNORMAL LOW (ref 6.5–8.1)

## 2016-01-08 LAB — GLUCOSE, CAPILLARY
GLUCOSE-CAPILLARY: 171 mg/dL — AB (ref 65–99)
GLUCOSE-CAPILLARY: 184 mg/dL — AB (ref 65–99)
GLUCOSE-CAPILLARY: 346 mg/dL — AB (ref 65–99)
Glucose-Capillary: 263 mg/dL — ABNORMAL HIGH (ref 65–99)
Glucose-Capillary: 331 mg/dL — ABNORMAL HIGH (ref 65–99)

## 2016-01-08 LAB — LACTIC ACID, PLASMA: Lactic Acid, Venous: 1.1 mmol/L (ref 0.5–1.9)

## 2016-01-08 LAB — VANCOMYCIN, RANDOM: Vancomycin Rm: 11

## 2016-01-08 LAB — MAGNESIUM: Magnesium: 2.3 mg/dL (ref 1.7–2.4)

## 2016-01-08 MED ORDER — VANCOMYCIN HCL IN DEXTROSE 1-5 GM/200ML-% IV SOLN
1000.0000 mg | Freq: Once | INTRAVENOUS | Status: AC
  Administered 2016-01-08: 1000 mg via INTRAVENOUS
  Filled 2016-01-08: qty 200

## 2016-01-08 MED ORDER — IPRATROPIUM-ALBUTEROL 0.5-2.5 (3) MG/3ML IN SOLN
3.0000 mL | RESPIRATORY_TRACT | Status: DC | PRN
  Administered 2016-01-08: 3 mL via RESPIRATORY_TRACT
  Filled 2016-01-08: qty 3

## 2016-01-08 MED ORDER — LORAZEPAM 2 MG/ML IJ SOLN: 0.5000 mg | Freq: Once | INTRAMUSCULAR | Status: DC | PRN

## 2016-01-08 MED ORDER — METHYLPREDNISOLONE SODIUM SUCC 125 MG IJ SOLR
60.0000 mg | Freq: Four times a day (QID) | INTRAMUSCULAR | Status: DC
  Administered 2016-01-08 – 2016-01-09 (×4): 60 mg via INTRAVENOUS
  Filled 2016-01-08 (×4): qty 2

## 2016-01-08 MED ORDER — LORAZEPAM 2 MG/ML IJ SOLN
0.5000 mg | Freq: Once | INTRAMUSCULAR | Status: AC
  Administered 2016-01-08: 0.5 mg via INTRAVENOUS

## 2016-01-08 MED ORDER — LORAZEPAM 2 MG/ML IJ SOLN
INTRAMUSCULAR | Status: AC
  Filled 2016-01-08: qty 1

## 2016-01-08 MED ORDER — HALOPERIDOL LACTATE 5 MG/ML IJ SOLN: 2.0000 mg | Freq: Once | INTRAMUSCULAR | Status: DC

## 2016-01-08 NOTE — Progress Notes (Signed)
OT Cancellation Note  Patient Details Name: [name] MRN: [id number] DOB: Dec 20, 1927   Cancelled Treatment:    Reason Eval/Treat Not Completed: Fatigue/lethargy limiting ability to participate - Pt was confused and agitated overnight, and is now resting.  RN requests therapy hold off at this time.  Will check back tomorrow.   Darlina Rumpf [place], OTR/L [phone]  01/08/2016, 9:32 AM

## 2016-01-08 NOTE — Progress Notes (Signed)
Attempted to perform ABI, however wife is refusing. RN is aware. Please reorder if/when patient/wife is willing to have test performed.  01/08/2016 11:06 AM Maudry Mayhew, RVT, RDCS, RDMS

## 2016-01-08 NOTE — Clinical Documentation Improvement (Signed)
Hospitalist  Abnormal Lab/Test Results:   Sodium: 6/24: 127. 6/25: 130. 6/26: 131.   Possible Clinical Conditions associated with below indicators  Hyponatremia  Other Condition  Cannot Clinically Determine   Please exercise your independent, professional judgment when responding. A specific answer is not anticipated or expected.   Thank You,  [place] [phone]

## 2016-01-08 NOTE — Progress Notes (Signed)
Pharmacy Antibiotic Note  [name] is a 82 y.o. male admitted on 12/17/2015 with foot swelling and fever concerning for cellulitis. X-ray negative for osteo. Pharmacy has been consulted for Vancomycin + Cefepime dosing along with Flagyl per MD.  The patient is noted to have CKD V and refuses HD. SCr 5.29 << 4.5, CrCl<15 ml/min. +UOP though not accurately charted. The patient was loaded with Vancomycin 2g on 6/24-6/25. A random Vancomycin level this morning resulted as 11 mcg/ml - the patient is okay to receive a Vancomycin dose this morning.   Plan: 1. Vancomycin 1g IV x 1 dose now 2. Will watch the patient's renal function, UOP, and will consider a VR on 6/29 AM to help determine timing of additional doses 3. Continue Cefepime 500 mg IV every 24 hours 4. Will continue to follow renal function, culture results, LOT, and antibiotic de-escalation plans   Height: '5\' 10"'$  (177.8 cm) Weight: 179 lb 8 oz (81.421 kg) IBW/kg (Calculated) : 73  Temp (24hrs), Avg:97.9 F (36.6 C), Min:97.5 F (36.4 C), Max:98.6 F (37 C)   Recent Labs Lab 12/18/2015 2010 12/24/2015 2020 01/06/16 0506 01/06/16 0857 01/07/16 0250 01/08/16 0816 01/08/16 1145  WBC 16.6*  --  16.4*  --  16.3* 16.0*  --   CREATININE 3.96*  --  4.04*  --  4.50* 5.29*  --   LATICACIDVEN  --  0.71 0.6 0.7  --   --   --   VANCORANDOM  --   --   --   --   --   --  11    Estimated Creatinine Clearance: 10.5 mL/min (by C-G formula based on Cr of 5.29).    No Known Allergies  Antimicrobials this admission: Vanc (total 2g load) 6/25 >> Zosyn 6/25 x 1  Cefepime 6/25 >> Metronidazole 6/25 >>  Dose adjustments this admission: * 6/27 VR (~56 hours post LD)  11 mcg/ml >> 1g IV x 1  Microbiology results: 6/24 MRSA PCR>> neg 6/24 BCx x2>> ngx2d  Thank you for allowing pharmacy to be a part of this patient's care.  Alycia Rossetti, PharmD, BCPS Clinical Pharmacist Pager: [phone] 01/08/2016 2:10 PM

## 2016-01-08 NOTE — Progress Notes (Signed)
[facility] Progress Note  Patient: [name] NHA:[id number]   PCP: Mayra Neer, MD DOB: Jan 27, 1928   DOA: 12/27/2015   DOS: 01/08/2016   Date of Service: the patient was seen and examined on 01/08/2016  Subjective: Agitation continues to remain an issue. Bleeding is also worsening. No nausea no vomiting. No abdominal pain. Reported Nutrition: Minimal oral intake but tolerating it well  Brief hospital course: Pt. with PMH of CVA, DM, HTN, CKD IV, refusing dialysis; admitted on 01/09/2016, with complaint of right leg redness as well as foot ulcer, was found to have suspected right leg diabetic foot infection along with anemia. Patient was transfused 1 unit of PRBC and without shortness of breath as well as bilateral wheezing, minimal urine output with 100 mg of Lasix. Currently further plan is continue respiratory support, continue antibiotics.  Assessment and Plan: 1. Type 2 diabetes mellitus with right diabetic foot infection ([place]) Presents with right leg redness as well as right fifth toe ulcer and infection. Leukocytosis on admission with anemia, ESR 111, CRP 19.1. No Lactic acidosis, blood culture no growth for 24 hours. MRSA PCR negative. Patient was initially started on vancomycin and Zosyn. Currently I would utilize diabetic foot order set with cefepime and Flagyl and vancomycin. If blood culture remains negative for 48 hours we will discontinue vancomycin. Orthopedics consulted and feels patient is not a surgical candidate. Vascular ABI ordered. MRI foot ordered. We will monitor.  2. Acute respiratory distress. Transfusion associated volume overload versus lung injury. Patient has progressive shortness of breath at home, but developed significant shortness of breath after receiving blood transfusion. Bilateral expiratory wheezing on my exam persistent today. Continue DuoNeb's, add steroids. ABG shows mild hypoxia unchanged from prior. Negative ins and outs after IV  Lasix. Will use BiPAP when necessary. Continue duo nebs every 4 hours.  3. Acute on Chronic kidney disease stage IV. Hyponatremia, resolved Patient has refused hemodialysis in the past and continues to do so. Renal function worsened after IV diuresis. At present we will hold off on further diuresis. Family was informed regarding patient's guarded prognosis as well as recommendations from nephrology. At present family would like to pursue discussion with palliative care.  4. Type 2 diabetes mellitus. Uncontrolled Hemoglobin A1c 8.1 in May 2017. Blood sugars appears to be well controlled at present. We will continue sliding scale insulin. Monitor while the patient remained on steroids  5. Concern for aspiration. Regular diet recommended by speech therapy.  6. Anemia of chronic disease. Patient presented with hemoglobin of 7.3, recently discharged with hemoglobin of 9.6. Patient received one unit of PRBC transfusion and hemoglobin rise to appropriately to 8.4. Developed respiratory distress probably from volume overload. Pathology lab recommend no evidence of transfusion reaction or TRALI. Check FOBT. Since the family refuses any active bleeding at home and the patient has a high risk for developing DVT I will continue with 81 mg aspirin as well as pharmacological DVT prophylaxis. Recheck CBC every 12 hours and if there is a drop discontinue the same.  7. Suspected acute on chronic diastolic dysfunction. Patient requiring significant respiratory support. Patient given IV Lasix, at present renal function worsening, will monitor. echo 55-60% EF.  8. Agitation. Started the patient on IV hydralazine as needed. Added Seroquel.  9. Goals of care discussion. Patient's mentation is progressively worsening with recurrent agitation not resolving with Haldol. Patient's renal function is also worsening with diuresis. Patient continues to have shortness of breath despite adequate diuresis as  well as having  increasing oxygen requirement. From room air performed liters at present. I discussed with family regarding their goals of care who prefers of the patient did not want to prolong the inevitable and would like to maintain patient comfortable. Consulted palliative care for further input.  Pain management: When necessary Tylenol Activity: Consulted physical therapy Bowel regimen: last BM 12/21/2015 Diet: Renal diet DVT Prophylaxis: subcutaneous Heparin  Advance goals of care discussion: DNR/DNI, palliative care consulted for possible hospice options.  Family Communication: family was present at bedside, at the time of interview. The pt provided permission to discuss medical plan with the family. Opportunity was given to ask question and all questions were answered satisfactorily.   Disposition:  Discharge to SNF probably versus hospice. Expected discharge date: Jan 28, 2016, improvement in respiratory distress as well as final disposition regarding the right toe infection  Consultants: Orthopedics Procedures: Echocardiogram, ABI  Antibiotics: Anti-infectives    Start     Dose/Rate Route Frequency Ordered Stop   01/08/16 1400  vancomycin (VANCOCIN) IVPB 1000 mg/200 mL premix     1,000 mg 200 mL/hr over 60 Minutes Intravenous  Once 01/08/16 1358 01/08/16 1533   01/06/16 1200  ceFEPIme (MAXIPIME) 500 mg in dextrose 5 % 50 mL IVPB     500 mg 100 mL/hr over 30 Minutes Intravenous Every 24 hours 01/06/16 1035     01/06/16 1030  metroNIDAZOLE (FLAGYL) IVPB 500 mg     500 mg 100 mL/hr over 60 Minutes Intravenous Every 8 hours 01/06/16 0943     01/06/16 0400  piperacillin-tazobactam (ZOSYN) IVPB 2.25 g  Status:  Discontinued     2.25 g 100 mL/hr over 30 Minutes Intravenous Every 8 hours 01/06/16 0227 01/06/16 1025   01/06/16 0330  vancomycin (VANCOCIN) IVPB 1000 mg/200 mL premix     1,000 mg 200 mL/hr over 60 Minutes Intravenous  Once 01/06/16 0236 01/06/16 0545   12/24/2015  2100  vancomycin (VANCOCIN) IVPB 1000 mg/200 mL premix     1,000 mg 200 mL/hr over 60 Minutes Intravenous  Once 01/04/2016 2051 01/07/2016 2217        Intake/Output Summary (Last 24 hours) at 01/08/16 1655 Last data filed at 01/08/16 1345  Gross per 24 hour  Intake    540 ml  Output    600 ml  Net    -60 ml   Filed Weights   01/06/16 1530 01/08/16 0418  Weight: 83.598 kg (184 lb 4.8 oz) 81.421 kg (179 lb 8 oz)    Objective: Physical Exam: Filed Vitals:   01/08/16 0728 01/08/16 1136 01/08/16 1613 01/08/16 1642  BP: 118/69 105/68 123/64   Pulse: 100 100 93   Temp:  97.5 F (36.4 C) 96.1 F (35.6 C)   TempSrc:  Axillary Axillary   Resp: _0 Height:      Weight:      SpO2: 90% 93% 98% 93%    General: Alert, Awake and Oriented to Time, Place and Person. Appear in moderate distress Eyes: Conjunctiva normal ENT: Oral Mucosa clear moist. Neck: difficult to assess JVD, no Abnormal Mass Or lumps Cardiovascular: S1 and S2 Present, aortic systolic Murmur, Respiratory: Bilateral Air entry equal and Decreased, basal Crackles, bilateral wheezes Abdomen: Bowel Sound present, Soft and no tenderness Skin: no redness, no Rash  Extremities: right Pedal edema, no calf tenderness Neurologic: Grossly no focal neuro deficit. Bilaterally Equal motor strength  Data Reviewed: CBC:  Recent Labs Lab 01/04/2016 2010 01/06/16 0506 01/07/16 0250 01/08/16 0816  WBC  16.6* 16.4* 16.3* 16.0*  NEUTROABS 14.3*  --  13.5* 13.9*  HGB 7.3* 8.4* 8.9* 8.2*  HCT 22.8* 25.8* 26.3* 24.9*  MCV 98.3 95.2 95.3 97.3  PLT 223 222 267 417   Basic Metabolic Panel:  Recent Labs Lab 01/08/2016 2010 01/06/16 0506 01/07/16 0250 01/08/16 0816  NA 127* 130* 131* 135  K 4.4 4.4 3.8 4.7  CL 103 106 104 106  CO2 13* 14* 16* 11*  GLUCOSE 315* 202* 89 165*  BUN 54* 56* 60* 83*  CREATININE 3.96* 4.04* 4.50* 5.29*  CALCIUM 7.9* 8.0* 8.3* 8.3*  MG  --   --  1.6* 2.3    Liver Function Tests:  Recent  Labs Lab 12/25/2015 2010 01/06/16 0506 01/07/16 0250 01/08/16 0816  AST _0 39  ALT 13* 14* 17 22  ALKPHOS 52 55 65 53  BILITOT 0.5 0.9 0.9 0.7  PROT 6.0* 6.4* 6.8 6.4*  ALBUMIN 2.8* 2.7* 2.9* 2.7*    Recent Labs Lab 12/21/2015 2010  LIPASE 31   No results for input(s): AMMONIA in the last 168 hours. Coagulation Profile:  Recent Labs Lab 01/07/16 0250  INR 1.25   Cardiac Enzymes:  Recent Labs Lab 01/07/16 0250  TROPONINI 0.25*   BNP (last 3 results) No results for input(s): PROBNP in the last 8760 hours.  CBG:  Recent Labs Lab 01/07/16 1722 01/07/16 2007 01/08/16 0727 01/08/16 1139 01/08/16 1613  GLUCAP 138* 139* 171* 184* 263*    Studies: Dg Eye Foreign Body  01/08/2016  CLINICAL DATA:  Metal working/exposure; clearance prior to MRI EXAM: ORBITS FOR FOREIGN BODY - 2 VIEW COMPARISON:  Head CT 11/12/2015 FINDINGS: There is no evidence of metallic foreign body within the orbits. No significant bone abnormality identified. IMPRESSION: No evidence of metallic foreign body within the orbits. Electronically Signed   By: Monte Fantasia M.D.   On: 01/08/2016 13:08   Dg Chest Port 1 View  01/08/2016  CLINICAL DATA:  Acute hypoxemic respiratory failure EXAM: PORTABLE CHEST 1 VIEW COMPARISON:  Two days ago FINDINGS: Decreased interstitial prominence suggesting edema that has improved. Pneumonitis, including related to possible TRALI would be indistinguishable. Normal heart size and stable mediastinal contours. No effusion or pneumothorax. Changes of lung cancer treatment on the right. IMPRESSION: Decreased interstitial opacity favoring improved edema. Electronically Signed   By: Monte Fantasia M.D.   On: 01/08/2016 07:28     Scheduled Meds: . antiseptic oral rinse  7 mL Mouth Rinse BID  . aspirin EC  81 mg Oral Daily  . carvedilol  3.125 mg Oral BID WC  . ceFEPime (MAXIPIME) IV  500 mg Intravenous Q24H  . feeding supplement  1 Container Oral TID WC  . heparin   5,000 Units Subcutaneous Q8H  . hydrALAZINE  25 mg Oral Q8H  . insulin aspart  0-5 Units Subcutaneous QHS  . insulin aspart  0-9 Units Subcutaneous TID WC  . isosorbide mononitrate  30 mg Oral Daily  . methylPREDNISolone (SOLU-MEDROL) injection  60 mg Intravenous Q6H  . metronidazole  500 mg Intravenous Q8H  . QUEtiapine  25 mg Oral QHS  . sodium chloride flush  3 mL Intravenous Q12H   Continuous Infusions:  PRN Meds: acetaminophen **OR** acetaminophen, glycopyrrolate, haloperidol lactate, ipratropium-albuterol, LORazepam  Time spent: 30 minutes  Author: Berle Mull, MD [facility] Pager: [phone] 01/08/2016 4:55 PM  If 7PM-7AM, please contact night-coverage at [email or web address], password [facility]

## 2016-01-08 NOTE — Progress Notes (Signed)
PT Cancellation Note  Patient Details Name: [name] MRN: [id number] DOB: 1928/04/08   Cancelled Treatment:    Reason Eval/Treat Not Completed: Medical issues which prohibited therapy.  RN requests to hold PT this AM.  Pt was confused and agitated overnight and is finally resting.  Will attempt to see pt again this afternoon, schedule permitting.   Collie Siad PT, DPT  Pager: [phone] Phone: [phone] 01/08/2016, 9:25 AM

## 2016-01-08 NOTE — Clinical Documentation Improvement (Signed)
Hospitalist  Can the diagnosis of diastolic dysfunction be further specified?     Acuity - Acute, Chronic, Acute on Chronic   Type - Systolic, Diastolic, Systolic and Diastolic  Other  Clinically Undetermined  Document any associated diagnoses/conditions  Supporting Information: Suspected acute on chronic diastolic dysfunction per 5/64 progress notes. IV Lasix, echo 55-60 % EF, requiring significant respiratory support per 6/26 progress notes. 6/25 CXR IMPRESSION: Elevation of the left hemidiaphragm. Vascular congestion noted. Increased interstitial markings raise concern for pulmonary edema.    Please exercise your independent, professional judgment when responding. A specific answer is not anticipated or expected.   Thank You,  [place] [phone]

## 2016-01-08 NOTE — Consult Note (Signed)
Consultation Note Date: 01/08/2016   Patient Name: [name]  DOB: 02/20/1928  MRN: [id number]  Age / Sex: 82 y.o., male  PCP: Mayra Neer, MD Referring Physician: Lavina Hamman, MD  Reason for Consultation: Establishing goals of care, Hospice Evaluation and Psychosocial/spiritual support  HPI/Patient Profile: 82 y.o. male  with past medical history of CVA, diabetes, Lung cancer followed by Dr. Earlie Server diagnosed in 2008, hypertension, chronic kidney disease stage IV, anemia of chronic disease, diastolic heart failure, admitted on 01/08/2016 with right leg redness as well as foot ulcer, anemia. Patient was transfused 1 unit of PRBC, and developed shortness of breath as well as bilateral wheezing. He has had minimal urine output with 100 mg of Lasix. His creatinine today is 5.29 and those values appear to be stabilizing. As per Dr. Serita Grit note despite renal function stabilizing patient's mentation is worsening. There is some question whether he has osteomyelitis of the left fifth toe. Per orthopedics patient is not a surgical candidate.  Clinical Assessment and Goals of Care: Met with patient's wife and daughter. Patient has 3 children. We did review the pathophysiology behind acute on chronic kidney disease stage IV with associated volume overload, diastolic heart failure, as well as the overriding Concern that despite medical management patient is not improving. When he is awake he is been exhibiting a pattern of agitation. He is only eating bites and sips. Family shares that this was his appetite prior to even admission. His functional status prior to hospitalization was being able to use a lift chair to get up to his walker and ambulate into another room. Patient is unable to follow commands has not been able to participate in physical therapy or other diagnostic testing. Wife states that 1. she wishes  things would become clear and that if it was his time that he would just pass peacefully during this hospitalization. Barring this she is very adamant that she wants to take him home and feels that his mentation will improve at home. I did support part of this decision and that he probably would feel better at home but my concern is that he will not even returning to his previous functional status in terms of being able to use his walker which she states he would need to be able to do in order for her to manage him at home. She has 3 children but they do live out of town and she is speaking in terms of managing this alone. She is very receptive to hospice support in the house. Both wife and daughter are still struggling with how fast this decline has felt they are still interested in getting an MRI as well as vascular study even though they do understand that he is not a surgical candidate and the results from these studies would not change the overall treatment plan.  [name], spouse    SUMMARY OF RECOMMENDATIONS   DO NOT RESUSCITATE DO NOT INTUBATE Continue antibiotics and other medical interventions unchanged for now Attempt to pursue MRI  as well as vascular study No hemodialysis No PEG tube or NG tube Feel that this family likely just needs a few more days to see if his lack of responsiveness is more of a pattern versus a couple of bad days Code Status/Advance Care Planning:  DNR    Symptom Management:   Agitation: Continue his Seroquel 25 mg daily at bedtime. Seroquel has a very wide dosing range; sometimes at lower doses can be more sedating but could also be used on as needed basis if patient is able to take by mouth meds. Continue with  Haldol 1 mg IV every 6 when necessary. This may actually be less sedating for patient than Seroquel. Monitor for need for scheduled dosing. Also might be helpful to rule out unmanaged pain as an underlying component of agitation.  Pain: If  unmanaged pain is suspected as a component of agitation, would recommend low-dose fentanyl or Dilaudid in setting of chronic kidney disease stage IV. This patient is not eating and is minimally responsive today would likely need to start with IV medication. Both of these agents would be short acting but also are sedating and can confuse the picture of this ongoing encephalopathy  Secretions:  If secretions remain a problem would recommend changing Robinul from by mouth to 0.2 mg IV when necessary or on a scheduled dose  Palliative Prophylaxis:   Aspiration, Bowel Regimen, Delirium Protocol, Frequent Pain Assessment, Oral Care and Turn Reposition  Additional Recommendations (Limitations, Scope, Preferences):  Avoid Hospitalization, No Artificial Feeding, No Chemotherapy, No Hemodialysis, No Radiation, No Surgical Procedures and No Tracheostomy  Psycho-social/Spiritual:   Desire for further Chaplaincy support:no   Prognosis:   Unable to determine but patient would qualify certainly for in-home hospice services with an associated prognosis of less than 6 months in the setting of chronic kidney disease stage IV with associated diastolic heart failure and encephalopathy. I think over the next 24-48 hours patient may declare himself as either improving or truly transitioning towards end-of-life  Discharge Planning: To Be Determined      Primary Diagnoses: Present on Admission:  . Diabetes mellitus with complication ([place]) . Absolute anemia . CVA (cerebral vascular accident) ([place]) . Chronic kidney disease (CKD), stage IV (severe) ([place]) . 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 Acute respiratory distress ([facility]) . Blind right eye . Fluid overload due to transfusion of blood vs TRALI . Hyponatremia  I have reviewed the medical record, interviewed the patient and family, and examined the patient. The following aspects are pertinent.  Past Medical History    Diagnosis Date  . Diabetes mellitus   . Hypertension   . Hypercholesteremia   . Stroke ([place])   . PNA (pneumonia)   . Detached retina, right   . Cancer [facility])     lung ca dx'd 2008   Social History   Social History  . Marital Status: Married    Spouse Name: N/A  . Number of Children: N/A  . Years of Education: N/A   Social History Main Topics  . Smoking status: Former Smoker -- 1.00 packs/day for 78 years    Quit date: 07/14/2004  . Smokeless tobacco: None  . Alcohol Use: No  . Drug Use: No  . Sexual Activity: Not Asked   Other Topics Concern  . None   Social History Narrative   Family History  Problem Relation Age of Onset  . Cancer Mother   . Cancer Brother    Scheduled Meds: .  antiseptic oral rinse  7 mL Mouth Rinse BID  . aspirin EC  81 mg Oral Daily  . carvedilol  3.125 mg Oral BID WC  . ceFEPime (MAXIPIME) IV  500 mg Intravenous Q24H  . feeding supplement  1 Container Oral TID WC  . heparin  5,000 Units Subcutaneous Q8H  . hydrALAZINE  25 mg Oral Q8H  . insulin aspart  0-5 Units Subcutaneous QHS  . insulin aspart  0-9 Units Subcutaneous TID WC  . isosorbide mononitrate  30 mg Oral Daily  . methylPREDNISolone (SOLU-MEDROL) injection  60 mg Intravenous Q6H  . metronidazole  500 mg Intravenous Q8H  . QUEtiapine  25 mg Oral QHS  . sodium chloride flush  3 mL Intravenous Q12H   Continuous Infusions:  PRN Meds:.acetaminophen **OR** acetaminophen, glycopyrrolate, haloperidol lactate, ipratropium-albuterol, LORazepam Medications Prior to Admission:  Prior to Admission medications   Medication Sig Start Date End Date Taking? Authorizing Provider  acetaminophen (TYLENOL) 500 MG tablet Take 1,000 mg by mouth every 8 (eight) hours as needed for moderate pain.   Yes Historical Provider, MD  amLODipine (NORVASC) 10 MG tablet Take 10 mg by mouth daily. 01/02/16  Yes Historical Provider, MD  aspirin 81 MG tablet Take 81 mg by mouth daily.   Yes Historical Provider, MD   B Complex-Folic Acid (SUPER B COMPLEX MAXI) TABS Take 1 tablet by mouth daily.   Yes Historical Provider, MD  Capsaicin-Menthol (SALONPAS GEL EX) Apply 1 application topically daily as needed (for pain).   Yes Historical Provider, MD  carvedilol (COREG) 3.125 MG tablet Take 1 tablet (3.125 mg total) by mouth 2 (two) times daily with a meal. 11/14/15  Yes Bonnell Public, MD  insulin lispro (HUMALOG) 100 UNIT/ML injection Inject 20 Units into the skin every evening. 20 units in evening only   Yes Historical Provider, MD  Liniments (SALONPAS PAIN RELIEF PATCH EX) Apply 1 patch topically daily as needed (for pain).   Yes Historical Provider, MD  amLODipine (NORVASC) 5 MG tablet Take 1 tablet (5 mg total) by mouth daily. 11/14/15   Bonnell Public, MD  insulin detemir (LEVEMIR) 100 UNIT/ML injection Inject 30 Units into the skin at bedtime. Reported on 07/02/2015    Historical Provider, MD   No Known Allergies Review of Systems  Unable to perform ROS: Mental status change  HENT: Positive for ear discharge.     Physical Exam  Constitutional: He appears well-developed.  HENT:  Head: Normocephalic and atraumatic.  Neck: Normal range of motion.  Pulmonary/Chest: Effort normal.  Neurological:  somnalent  Skin: Skin is warm.  Psychiatric:  Currently resting but when awake has been exhibiting a pattern of agitation  Nursing note and vitals reviewed.   Vital Signs: BP 123/64 mmHg  Pulse 93  Temp(Src) 96.1 F (35.6 C) (Axillary)  Resp 10  Ht 5' 10"  (1.778 m)  Wt 81.421 kg (179 lb 8 oz)  BMI 25.76 kg/m2  SpO2 93% Pain Assessment: No/denies pain POSS *See Group Information*: 1-Acceptable,Awake and alert Pain Score: Asleep   SpO2: SpO2: 93 % O2 Device:SpO2: 93 % O2 Flow Rate: .O2 Flow Rate (L/min): 5 L/min  IO: Intake/output summary:  Intake/Output Summary (Last 24 hours) at 01/08/16 1749 Last data filed at 01/08/16 1345  Gross per 24 hour  Intake    540 ml  Output    600 ml   Net    -60 ml    LBM: Last BM Date: 01/06/16 Baseline Weight: Weight: 83.598 kg (184  lb 4.8 oz) Most recent weight: Weight: 81.421 kg (179 lb 8 oz)     Palliative Assessment/Data:   Flowsheet Rows        Most Recent Value   Intake Tab    Referral Department  Hospitalist   Unit at Time of Referral  Intermediate Care Unit   Palliative Care Primary Diagnosis  Nephrology   Date Notified  01/08/16   Palliative Care Type  New Palliative care   Reason for referral  Clarify Goals of Care, Counsel Regarding Hospice   Date of Admission  01/04/2016   Date first seen by Palliative Care  01/08/16   # of days Palliative referral response time  0 Day(s)   # of days IP prior to Palliative referral  3   Clinical Assessment    Palliative Performance Scale Score  30%   Pain Max last 24 hours  Not able to report   Pain Min Last 24 hours  Not able to report   Dyspnea Max Last 24 Hours  Not able to report   Dyspnea Min Last 24 hours  Not able to report   Nausea Max Last 24 Hours  Not able to report   Nausea Min Last 24 Hours  Not able to report   Anxiety Max Last 24 Hours  Not able to report   Anxiety Min Last 24 Hours  Not able to report   Other Max Last 24 Hours  Not able to report   Psychosocial & Spiritual Assessment    Palliative Care Outcomes    Patient/Family meeting held?  Yes   Who was at the meeting?  wife and dtr   Palliative Care Outcomes  Counseled regarding hospice   Patient/Family wishes: Interventions discontinued/not started   Hemodialysis, Mechanical Ventilation, NIPPV, Trach, PEG, Vasopressors, Tube feedings/TPN   Palliative Care follow-up planned  Yes, Facility      Time In: 1600 Time Out: 1730 Time Total: 90 min Greater than 50%  of this time was spent counseling and coordinating care related to the above assessment and plan.  Signed by: Dory Horn, NP   Please contact Palliative Medicine Team phone at [phone] for questions and concerns.  For individual  provider: See Shea Evans

## 2016-01-08 NOTE — Progress Notes (Signed)
Pt. Had little urinary output with condom cath. Bladder scan showed urinary retention of approx. 82m.  Per order, did in/out cath and approx. 800 mls removed.  Patient very lethargic through the shift, but awakens on arousal.  Pt. Unable to tolerate tests because he becomes restless and combative. Patient did not have an appetite. Did not think it was safe for him to eat, because of aspiration risk, while lethargic.

## 2016-01-09 LAB — CBC
HCT: 25.2 % — ABNORMAL LOW (ref 39.0–52.0)
HEMOGLOBIN: 8.4 g/dL — AB (ref 13.0–17.0)
MCH: 32.4 pg (ref 26.0–34.0)
MCHC: 33.3 g/dL (ref 30.0–36.0)
MCV: 97.3 fL (ref 78.0–100.0)
Platelets: 322 10*3/uL (ref 150–400)
RBC: 2.59 MIL/uL — ABNORMAL LOW (ref 4.22–5.81)
RDW: 16.4 % — AB (ref 11.5–15.5)
WBC: 18.9 10*3/uL — ABNORMAL HIGH (ref 4.0–10.5)

## 2016-01-09 LAB — TYPE AND SCREEN
ABO/RH(D): A POS
Antibody Screen: NEGATIVE
UNIT DIVISION: 0
Unit division: 0

## 2016-01-09 LAB — BASIC METABOLIC PANEL
ANION GAP: 24 — AB (ref 5–15)
BUN: 128 mg/dL — AB (ref 6–20)
CALCIUM: 8.2 mg/dL — AB (ref 8.9–10.3)
CO2: 8 mmol/L — AB (ref 22–32)
CREATININE: 6.69 mg/dL — AB (ref 0.61–1.24)
Chloride: 102 mmol/L (ref 101–111)
GFR calc Af Amer: 8 mL/min — ABNORMAL LOW (ref >60)
GFR, EST NON AFRICAN AMERICAN: 7 mL/min — AB (ref >60)
GLUCOSE: 342 mg/dL — AB (ref 65–99)
Potassium: 5.7 mmol/L — ABNORMAL HIGH (ref 3.5–5.1)
Sodium: 134 mmol/L — ABNORMAL LOW (ref 135–145)

## 2016-01-09 LAB — GLUCOSE, CAPILLARY: GLUCOSE-CAPILLARY: 373 mg/dL — AB (ref 65–99)

## 2016-01-09 LAB — MAGNESIUM: MAGNESIUM: 2.6 mg/dL — AB (ref 1.7–2.4)

## 2016-01-09 MED ORDER — BIOTENE DRY MOUTH MT LIQD: 15.0000 mL | OROMUCOSAL | Status: DC | PRN

## 2016-01-09 MED ORDER — GLYCOPYRROLATE 0.2 MG/ML IJ SOLN
0.2000 mg | Freq: Two times a day (BID) | INTRAMUSCULAR | Status: DC
  Filled 2016-01-09 (×3): qty 1

## 2016-01-09 MED ORDER — POLYVINYL ALCOHOL 1.4 % OP SOLN: 1.0000 [drp] | Freq: Four times a day (QID) | OPHTHALMIC | Status: DC | PRN

## 2016-01-09 MED ORDER — MORPHINE SULFATE (PF) 2 MG/ML IV SOLN: 1.0000 mg | INTRAVENOUS | Status: DC | PRN

## 2016-01-09 MED ORDER — GLYCOPYRROLATE 1 MG PO TABS
1.0000 mg | ORAL_TABLET | ORAL | Status: DC | PRN
  Filled 2016-01-09: qty 1

## 2016-01-09 MED ORDER — ONDANSETRON HCL 4 MG/2ML IJ SOLN: 4.0000 mg | Freq: Four times a day (QID) | INTRAMUSCULAR | Status: DC | PRN

## 2016-01-09 MED ORDER — HALOPERIDOL LACTATE 5 MG/ML IJ SOLN: 0.5000 mg | INTRAMUSCULAR | Status: DC | PRN

## 2016-01-09 MED ORDER — GLYCOPYRROLATE 0.2 MG/ML IJ SOLN
0.2000 mg | INTRAMUSCULAR | Status: DC | PRN
  Filled 2016-01-09: qty 1

## 2016-01-09 MED ORDER — ACETAMINOPHEN 325 MG PO TABS: 650.0000 mg | ORAL_TABLET | Freq: Four times a day (QID) | ORAL | Status: DC | PRN

## 2016-01-09 MED ORDER — HALOPERIDOL LACTATE 2 MG/ML PO CONC
0.5000 mg | ORAL | Status: DC | PRN
  Filled 2016-01-09: qty 0.3

## 2016-01-09 MED ORDER — LORAZEPAM 2 MG/ML IJ SOLN: 1.0000 mg | INTRAMUSCULAR | Status: DC | PRN

## 2016-01-09 MED ORDER — ACETAMINOPHEN 650 MG RE SUPP: 650.0000 mg | Freq: Four times a day (QID) | RECTAL | Status: DC | PRN

## 2016-01-09 MED ORDER — ONDANSETRON 4 MG PO TBDP: 4.0000 mg | ORAL_TABLET | Freq: Four times a day (QID) | ORAL | Status: DC | PRN

## 2016-01-09 MED ORDER — LORAZEPAM 2 MG/ML PO CONC: 1.0000 mg | ORAL | Status: DC | PRN

## 2016-01-09 MED ORDER — HALOPERIDOL 0.5 MG PO TABS
0.5000 mg | ORAL_TABLET | ORAL | Status: DC | PRN
  Filled 2016-01-09: qty 1

## 2016-01-09 MED ORDER — MORPHINE BOLUS VIA INFUSION
2.0000 mg | INTRAVENOUS | Status: DC | PRN
  Administered 2016-01-09 (×5): 2 mg via INTRAVENOUS
  Filled 2016-01-09 (×6): qty 2

## 2016-01-09 MED ORDER — BIOTENE DRY MOUTH MT LIQD
15.0000 mL | OROMUCOSAL | Status: DC | PRN
Start: 2016-01-09 — End: 2016-01-10

## 2016-01-09 MED ORDER — ONDANSETRON 4 MG PO TBDP
4.0000 mg | ORAL_TABLET | Freq: Four times a day (QID) | ORAL | Status: DC | PRN
  Filled 2016-01-09: qty 1

## 2016-01-09 MED ORDER — MORPHINE SULFATE 25 MG/ML IV SOLN
2.0000 mg/h | INTRAVENOUS | Status: DC
  Administered 2016-01-09: 1 mg/h via INTRAVENOUS
  Filled 2016-01-09: qty 10

## 2016-01-09 MED ORDER — DIPHENHYDRAMINE HCL 50 MG/ML IJ SOLN: 12.5000 mg | INTRAMUSCULAR | Status: DC | PRN

## 2016-01-09 MED ORDER — IPRATROPIUM-ALBUTEROL 0.5-2.5 (3) MG/3ML IN SOLN
3.0000 mL | RESPIRATORY_TRACT | Status: DC | PRN
  Filled 2016-01-09: qty 3

## 2016-01-09 MED ORDER — IPRATROPIUM-ALBUTEROL 0.5-2.5 (3) MG/3ML IN SOLN: 3.0000 mL | Freq: Four times a day (QID) | RESPIRATORY_TRACT | Status: DC

## 2016-01-09 MED ORDER — BISACODYL 10 MG RE SUPP: 10.0000 mg | Freq: Every day | RECTAL | Status: DC | PRN

## 2016-01-09 MED ORDER — LORAZEPAM 1 MG PO TABS: 1.0000 mg | ORAL_TABLET | ORAL | Status: DC | PRN

## 2016-01-09 MED ORDER — GLYCOPYRROLATE 0.2 MG/ML IJ SOLN: 0.2000 mg | INTRAMUSCULAR | Status: DC | PRN

## 2016-01-09 MED ORDER — GLYCOPYRROLATE 1 MG PO TABS: 1.0000 mg | ORAL_TABLET | ORAL | Status: DC | PRN

## 2016-01-09 NOTE — Progress Notes (Signed)
Pt continuously having a low rectal temp. Pt is restless and agitated, not keeping the warming blanket on.  Family at bedside.  RN discussed with family mutiple times the importance of keeping on the warming blanket.  Haldol was given at 2204. Son and Daughter stated on two different occasions that they do not want pt to have any haldol or ativan because it will make him sleep throughout the day.  RN made K.Kirby, NP aware of temps and the families decision of not wanting pt to have anything to help with restlessness and agitation.

## 2016-01-09 NOTE — Progress Notes (Signed)
   01/09/16 1400  Clinical Encounter Type  Visited With Patient and family together  Visit Type Patient actively dying  Referral From Palliative care team  Consult/Referral To Chaplain  Stress Factors  Patient Stress Factors Loss of control  Family Stress Factors Not reviewed  CHP responded to EOL [place]. Family was gathered with pastor.  CHP asked if they needed anything. Family/pastor indicated they were in good hands. Roe Coombs 01/09/2016

## 2016-01-09 NOTE — Progress Notes (Signed)
OT Cancellation Note  Patient Details Name: [name] MRN: [id number] DOB: May 26, 1928   Cancelled Treatment:    Reason Eval/Treat Not Completed: OT screened, no needs identified, will sign off. Per RN pt is transferring to comfort care, we will sign off.  Almon Register [phone] 01/09/2016, 11:20 AM

## 2016-01-09 NOTE — Progress Notes (Signed)
[facility] Progress Note  Patient: [name] GLO:[id number]   PCP: Mayra Neer, MD DOB: 1928/07/10   DOA: 01/06/2016   DOS: 01/09/2016   Date of Service: the patient was seen and examined on 01/09/2016  Subjective: The patient has been having increasing agitation as well as confusion. Minimal oral intake. Not following command. Nutrition: Minimal oral intake   Brief hospital course: Pt. with PMH of CVA, DM, HTN, CKD IV, refusing dialysis; admitted on 01/09/2016, with complaint of right leg redness as well as foot ulcer, was found to have suspected right leg diabetic foot infection along with anemia. Patient was transfused 1 unit of PRBC and without shortness of breath as well as bilateral wheezing, minimal urine output with 100 mg of Lasix. Currently further plan is to transition to comfort care, expecting in-hospital death.  Assessment and Plan: 1. 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 Leukocytosis on admission with anemia, ESR 111, CRP 19.1. No Lactic acidosis, blood culture no growth for 24 hours. MRSA PCR negative. Patient was initially started on vancomycin and Zosyn. Later on transition to cefepime and Flagyl and vancomycin. Orthopedics consulted and feels patient is not a surgical candidate. Vascular ABI and MRI foot ordered but cannot be completed due to patient's condition. At present the goal of care is to transition to comfort. We will discontinue antibiotics.  2. Acute respiratory distress. Transfusion associated volume overload versus lung injury. Pathology lab recommend no evidence of transfusion reaction or TRALI.  Patient has progressive shortness of breath at home, but developed significant shortness of breath after receiving blood transfusion. Bilateral expiratory wheezing persistent Continue DuoNeb's, no significant benefit after adding  steroids.. Initial ABG shows mild hypoxia unchanged from prior. Negative ins and outs after IV Lasix. Sweat which there is no significant of present patient's respiratory status and it actually continues to worsen. Discuss with family regarding goals of care and they would like to transition patient's care to comfort. Continue morphine with when necessary bolus in between for comfort.  3. Acute on Chronic kidney disease stage IV. Hyponatremia, resolved Acute metabolic encephalopathy from uremia  Patient has refused hemodialysis in the past and continues to do so. Renal function progressively worsened after IV diuresis. I briefly discussed with nephrology and also patient very well. Recommended to continue IV diuresis to monitor improvement initially but they were of the opinion that this could be a terminal event for the patient due to lack of diuresis response.  Family was informed regarding patient's guarded prognosis as well as recommendations from nephrology. With significant elevation in BUN patient appears to be uremic with acute encephalopathy. Discussed with patient's family regarding goals of care and they continued to mention that the patient has refused hemodialysis in the past and would not pursue that again. At present based on that information patient scheduled his transition to comfort care.  4. Type 2 diabetes mellitus. Uncontrolled Hemoglobin A1c 8.1 in May 2017. Comfort care   5. Concern for aspiration. Comfort feeds  6. Anemia of chronic disease. Patient presented with hemoglobin of 7.3, recently discharged with hemoglobin of 9.6. Patient received one unit of PRBC transfusion and hemoglobin rise to appropriately to 8.4. Developed respiratory distress probably from volume overload. Pathology lab recommend no evidence of transfusion reaction or TRALI. Not transition to comfort care. No evidence of active bleeding.  7. Suspected acute on chronic diastolic  dysfunction. Patient requiring significant  respiratory support. Patient given IV Lasix, at present renal function worsening, will monitor. echo 55-60% EF.  8. Agitation. Started the patient on IV Haldol as needed. Added Seroquel.  9. Goals of care discussion. Patient's mentation is progressively worsening with recurrent agitation not resolving with Haldol. Patient's renal function is also worsening with diuresis. Patient continues to have shortness of breath despite adequate diuresis as well as having increasing oxygen requirement. From room air performed liters at present. I discussed with family regarding their goals of care who prefers of the patient did not want to prolong the inevitable and would like to maintain patient comfortable. With worsening mentation today patient scheduled his transition to complete comfort care. Appreciate input from palliative care. Patient started on IV morphine for respiratory distress and agitation. Patient will be monitored in the stepdown unit at present, expecting prognosis of only hours. We'll discuss about transitioning to inpatient hospice facility 01/11/2016 if the patient is stable for transfer.  Pain management: Morphine infusion with when necessary morphine bolus. Bowel regimen: last BM 12/13/2015 Diet: Regular diet DVT Prophylaxis: subcutaneous Heparin  Advance goals of care discussion: DNR/DNI, comfort care expecting in hospital death.  Family Communication: family was present at bedside, at the time of interview. The pt provided permission to discuss medical plan with the family. Opportunity was given to ask question and all questions were answered satisfactorily.   Disposition:  Expecting in-hospital death.  Consultants: Orthopedics Procedures: Echocardiogram, ABI  Antibiotics: Anti-infectives    Start     Dose/Rate Route Frequency Ordered Stop   01/08/16 1400  vancomycin (VANCOCIN) IVPB 1000 mg/200 mL premix     1,000 mg 200  mL/hr over 60 Minutes Intravenous  Once 01/08/16 1358 01/08/16 1533   01/06/16 1200  ceFEPIme (MAXIPIME) 500 mg in dextrose 5 % 50 mL IVPB  Status:  Discontinued     500 mg 100 mL/hr over 30 Minutes Intravenous Every 24 hours 01/06/16 1035 01/09/16 0852   01/06/16 1030  metroNIDAZOLE (FLAGYL) IVPB 500 mg  Status:  Discontinued     500 mg 100 mL/hr over 60 Minutes Intravenous Every 8 hours 01/06/16 0943 01/09/16 0852   01/06/16 0400  piperacillin-tazobactam (ZOSYN) IVPB 2.25 g  Status:  Discontinued     2.25 g 100 mL/hr over 30 Minutes Intravenous Every 8 hours 01/06/16 0227 01/06/16 1025   01/06/16 0330  vancomycin (VANCOCIN) IVPB 1000 mg/200 mL premix     1,000 mg 200 mL/hr over 60 Minutes Intravenous  Once 01/06/16 0236 01/06/16 0545   12/16/2015 2100  vancomycin (VANCOCIN) IVPB 1000 mg/200 mL premix     1,000 mg 200 mL/hr over 60 Minutes Intravenous  Once 01/01/2016 2051 12/25/2015 2217        Intake/Output Summary (Last 24 hours) at 01/09/16 1634 Last data filed at 01/09/16 0210  Gross per 24 hour  Intake    200 ml  Output    800 ml  Net   -600 ml   Filed Weights   01/06/16 1530 01/08/16 0418 01/09/16 0820  Weight: 83.598 kg (184 lb 4.8 oz) 81.421 kg (179 lb 8 oz) 81.149 kg (178 lb 14.4 oz)    Objective: Physical Exam: Filed Vitals:   01/09/16 0820 01/09/16 0906 01/09/16 0958 01/09/16 1529  BP:  116/98    Pulse: 100 95 92 89  Temp: 95.6 F (35.3 C)     TempSrc: Rectal     Resp: _0 Height:      Weight: 81.149 kg (  178 lb 14.4 oz)     SpO2: 97% 94% 93% 88%    General: Alert, Awake and In moderate distress, not oriented and not following command. ENT: Oral Mucosa clear dry. Neck: difficult to assess JVD, no Abnormal Mass Or lumps Cardiovascular: S1 and S2 Present, aortic systolic Murmur, Respiratory: Bilateral Air entry equal and Decreased, basal Crackles, bilateral wheezes Abdomen: Bowel Sound present, Soft and no tenderness Skin: no redness, no Rash , right  fifth toe ulcer Extremities: right Pedal edema, no calf tenderness Neurologic: Disoriented and lethargic  Data Reviewed: CBC:  Recent Labs Lab 12/25/2015 2010 01/06/16 0506 01/07/16 0250 01/08/16 0816 01/08/16 2200 01/09/16 0500  WBC 16.6* 16.4* 16.3* 16.0* 17.0* 18.9*  NEUTROABS 14.3*  --  13.5* 13.9* 16.6*  --   HGB 7.3* 8.4* 8.9* 8.2* 8.6* 8.4*  HCT 22.8* 25.8* 26.3* 24.9* 26.9* 25.2*  MCV 98.3 95.2 95.3 97.3 98.9 97.3  PLT 223 222 267 263 310 902   Basic Metabolic Panel:  Recent Labs Lab 01/06/16 0506 01/07/16 0250 01/08/16 0816 01/08/16 2200 01/09/16 0500  NA 130* 131* 135 133* 134*  K 4.4 3.8 4.7 5.7* 5.7*  CL 106 104 106 105 102  CO2 14* 16* 11* 7* 8*  GLUCOSE 202* 89 165* 343* 342*  BUN 56* 60* 83* 104* 128*  CREATININE 4.04* 4.50* 5.29* 6.36* 6.69*  CALCIUM 8.0* 8.3* 8.3* 8.3* 8.2*  MG  --  1.6* 2.3  --  2.6*    Liver Function Tests:  Recent Labs Lab 12/31/2015 2010 01/06/16 0506 01/07/16 0250 01/08/16 0816 01/08/16 2200  AST _0 39 34  ALT 13* 14* _1 ALKPHOS 52 55 65 53 61  BILITOT 0.5 0.9 0.9 0.7 1.1  PROT 6.0* 6.4* 6.8 6.4* 6.4*  ALBUMIN 2.8* 2.7* 2.9* 2.7* 2.7*    Recent Labs Lab 12/25/2015 2010  LIPASE 31   No results for input(s): AMMONIA in the last 168 hours. Coagulation Profile:  Recent Labs Lab 01/07/16 0250  INR 1.25   Cardiac Enzymes:  Recent Labs Lab 01/07/16 0250  TROPONINI 0.25*   BNP (last 3 results) No results for input(s): PROBNP in the last 8760 hours.  CBG:  Recent Labs Lab 01/08/16 1139 01/08/16 1613 01/08/16 2202 01/08/16 2206 01/09/16 0817  GLUCAP 184* 263* 346* 331* 373*    Studies: No results found.   Scheduled Meds: . antiseptic oral rinse  7 mL Mouth Rinse BID  . feeding supplement  1 Container Oral TID WC  . glycopyrrolate  0.2 mg Intravenous BID  . sodium chloride flush  3 mL Intravenous Q12H   Continuous Infusions: . morphine 2 mg/hr (01/09/16 1529)   PRN Meds:  acetaminophen **OR** acetaminophen, antiseptic oral rinse, bisacodyl, diphenhydrAMINE, glycopyrrolate **OR** glycopyrrolate **OR** glycopyrrolate, haloperidol **OR** haloperidol **OR** haloperidol lactate, ipratropium-albuterol, LORazepam **OR** LORazepam **OR** LORazepam, morphine injection, morphine, ondansetron **OR** ondansetron (ZOFRAN) IV, polyvinyl alcohol  Time spent: 30 minutes  Author: Berle Mull, MD [facility] Pager: [phone] 01/09/2016 4:34 PM  If 7PM-7AM, please contact night-coverage at [email or web address], password [facility]

## 2016-01-09 NOTE — Progress Notes (Signed)
Daily Progress Note   Patient Name: [name]       Date: 01/09/2016 DOB: Feb 11, 1928  Age: 82 y.o. MRN#: [id number] Attending Physician: Lavina Hamman, MD Primary Care Physician: Mayra Neer, MD Admit Date: 01/11/2016  Reason for Consultation/Follow-up: Establishing goals of care, Hospice Evaluation and Terminal Care  Subjective: Patient is uremic and unable communicate intelligibly.  Wife and daughter are at bedside and very concerned.  They emphasize that they want him to be comfortable.  We discussed end of life as a result of kidney failure - potentially heart arrythmias or seizures could occur.  I reassured the family that with the right medications in place the patient would likely fall asleep and have a very peaceful passing.  Wife and daughter strongly agree with comfort measures.  We discussed his prognosis as being hours to days.  Family understands.  Length of Stay: 4  Current Medications: Scheduled Meds:  . antiseptic oral rinse  7 mL Mouth Rinse BID  . feeding supplement  1 Container Oral TID WC  . glycopyrrolate  0.2 mg Intravenous BID  . sodium chloride flush  3 mL Intravenous Q12H    Continuous Infusions: . morphine      PRN Meds: acetaminophen **OR** acetaminophen, antiseptic oral rinse, bisacodyl, diphenhydrAMINE, glycopyrrolate **OR** glycopyrrolate **OR** glycopyrrolate, haloperidol **OR** haloperidol **OR** haloperidol lactate, ipratropium-albuterol, LORazepam **OR** LORazepam **OR** LORazepam, morphine injection, morphine, ondansetron **OR** ondansetron (ZOFRAN) IV, polyvinyl alcohol  Physical Exam         Well developed, ill appearing male, thrashing about in the bed. CV - very distant (almost undetectable heart sounds) Resp - no resp distress.  Mild  exp wheeze Abdomen - distended.  Non tender Extremities - moving all 4  Vital Signs: BP 90/64 mmHg  Pulse 100  Temp(Src) 95.6 F (35.3 C) (Rectal)  Resp 27  Ht '5\' 10"'$  (1.778 m)  Wt 81.149 kg (178 lb 14.4 oz)  BMI 25.67 kg/m2  SpO2 98% SpO2: SpO2: 98 % O2 Device: O2 Device: Nasal Cannula O2 Flow Rate: O2 Flow Rate (L/min): 5 L/min  Intake/output summary:   Intake/Output Summary (Last 24 hours) at 01/09/16 1005 Last data filed at 01/09/16 0210  Gross per 24 hour  Intake    300 ml  Output    800 ml  Net   -500 ml  LBM: Last BM Date: 01/06/16 Baseline Weight: Weight: 83.598 kg (184 lb 4.8 oz) Most recent weight: Weight: 81.149 kg (178 lb 14.4 oz)       Palliative Assessment/Data:    Flowsheet Rows        Most Recent Value   Intake Tab    Referral Department  Hospitalist   Unit at Time of Referral  Intermediate Care Unit   Palliative Care Primary Diagnosis  Nephrology   Date Notified  01/08/16   Palliative Care Type  New Palliative care   Reason for referral  Clarify Goals of Care, Counsel Regarding Hospice   Date of Admission  12/27/2015   Date first seen by Palliative Care  01/08/16   # of days Palliative referral response time  0 Day(s)   # of days IP prior to Palliative referral  3   Clinical Assessment    Palliative Performance Scale Score  20%   Pain Max last 24 hours  Not able to report   Pain Min Last 24 hours  Not able to report   Dyspnea Max Last 24 Hours  Not able to report   Dyspnea Min Last 24 hours  Not able to report   Nausea Max Last 24 Hours  Not able to report   Nausea Min Last 24 Hours  Not able to report   Anxiety Max Last 24 Hours  Not able to report   Anxiety Min Last 24 Hours  Not able to report   Other Max Last 24 Hours  Not able to report   Psychosocial & Spiritual Assessment    Palliative Care Outcomes    Patient/Family meeting held?  Yes   Who was at the meeting?  wife and daughter   Palliative Care Outcomes  Improved non-pain  symptom therapy, Clarified goals of care, Changed to focus on comfort   Patient/Family wishes: Interventions discontinued/not started   Hemodialysis, Mechanical Ventilation, NIPPV, Trach, PEG, Vasopressors, Tube feedings/TPN   Palliative Care follow-up planned  Yes, Facility      Patient Active Problem List   Diagnosis Date Noted  . Hyponatremia 01/08/2016  . Palliative care encounter   . Chronic kidney disease (CKD), stage IV (severe) ([place]) 01/06/2016  . 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 Acute respiratory distress (HCC) 01/06/2016  . Blind right eye 01/06/2016  . Fluid overload due to transfusion of blood vs TRALI 01/06/2016  . Right foot infection 01/08/2016  . Cellulitis 12/19/2015  . Absolute anemia   . Acute on chronic renal failure ([place]) 11/12/2015  . Leukocytosis 11/12/2015  . Diabetes ([place]) 11/12/2015  . Malignant hypertension 11/12/2015  . Diabetes mellitus with complication ([place])   . CVA (cerebral vascular accident) ([place]) 11/09/2015  . Hypertension 07/02/2015  . Chronic renal failure 07/02/2015  . Primary cancer of right lower lobe of lung ([place]) 07/02/2011    Palliative Care Assessment & Plan   Patient Profile: 82 y.o. male with past medical history of CVA, diabetes, Lung cancer followed by Dr. Earlie Server diagnosed in 2008, hypertension, chronic kidney disease stage IV, anemia of chronic disease, diastolic heart failure, admitted on 12/24/2015 with right leg redness as well as foot ulcer, anemia. Patient was transfused 1 unit of PRBC, and developed shortness of breath as well as bilateral wheezing. He has had minimal urine output with 100 mg of Lasix. His creatinine today is 5.29 and those values appear to be stabilizing. As per Dr. Serita Grit note despite renal function stabilizing  patient's mentation is worsening. There is some question whether he has osteomyelitis of the left fifth toe. Per orthopedics patient is not a surgical  candidate.  Assessment: Patient is uremic, appears uncomfortable.  Patient appears to be transitioning into the actively dying phase.   Family distressed.  Wanting full comfort.    Recommendations/Plan:  Start morphine drip.  Will adjust based on boluses given  Added prn robinul, haldol, ativan for comfort  Hospital death anticipated.  After comfort medications have begun to take effect please gradually remove any life prolonging interventions (minimize oxygen, warming blanket, etc...)  Goals of Care and Additional Recommendations:  Limitations on Scope of Treatment: Full Comfort Care  Code Status:  DNR  Prognosis:   Hours - Days  Discharge Planning:  Anticipated Hospital Death  Care plan was discussed with [facility] attending, Family, RN.  Thank you for allowing the Palliative Medicine Team to assist in the care of this patient.   Time In: 9:00 Time Out: 9:35 Total Time 35 Prolonged Time Billed no      Greater than 50%  of this time was spent counseling and coordinating care related to the above assessment and plan.  Imogene Burn, PA-C Palliative Medicine Pager: [phone]  Please contact Palliative Medicine Team phone at [phone] for questions and concerns.

## 2016-01-09 NOTE — Progress Notes (Signed)
PT Cancellation Note  Patient Details Name: TALMAGE TEASTER MRN: [id number] DOB: Feb 28, 1928   Cancelled Treatment:    Reason Eval/Treat Not Completed: Medical issues which prohibited therapy.  Per conversation w/ RN, pt is transitioning to comfort care.  PT will sign off.   Collie Siad PT, DPT  Pager: [phone] Phone: [phone] 01/09/2016, 8:56 AM

## 2016-01-09 NOTE — Progress Notes (Signed)
At shift change pt had an incontinent episode and was wet from head to toe. Since then pt has not produced any urine. Bladder scan done and showed less than 46.  Tylene Fantasia, NP notified.  Will continue to monitor.

## 2016-01-09 NOTE — Progress Notes (Signed)
Rectal temp 95.6, warming blanket applied. Rechecked patient at 0850 and blanket was no longer on. Patient family states that he will not leave it on.

## 2016-01-09 NOTE — Progress Notes (Signed)
RN spoke with pt wife who stated "At this point I don't think the things that are being done are really helping him. How do I get in touch with Hospice? I would like to take him home. I think that we don't need to continue doing as much".  At this time pt is slightly combative and trying to get out of bed.  Wife does not want pt to have any medication to calm him down and feels that he will sleep for the rest of the day.  Will pass on to Day Shift RN to make Providers aware.

## 2016-01-09 NOTE — Progress Notes (Signed)
Informed Tylene Fantasia, NP of pt rectal temp 95.7.  Orders given for Warming Blanket.  Warming blanket ordered. Will continue to monitor.

## 2016-01-09 NOTE — Care Management Important Message (Signed)
Important Message  Patient Details  Name: [name] MRN: [id number] Date of Birth: 1928-01-25   Medicare Important Message Given:  Yes    Loann Quill 01/09/2016, 9:52 AM

## 2016-01-09 NOTE — Progress Notes (Signed)
Family refusing for patient to be transferred to 6N. NP and [facility] aware. Ok to let patient stay the night on this unit. Will address in the AM.  M.Fredric Slabach, RN

## 2016-01-09 NOTE — Progress Notes (Signed)
RT called to pts bedside for wheeze. Pt has an upper airway wh. BBS clr dim. Pt comfort care. Pts family member states pt has been agitated and they would like him to be comfortable. RN notified

## 2016-01-09 NOTE — Progress Notes (Signed)
Informed patients family of new room assignment 6N08, escorted patient son and wife to tour the new room. Family has decided that they wish to remain on 2C in current room.

## 2016-01-10 DIAGNOSIS — R451 Restlessness and agitation: Secondary | ICD-10-CM | POA: Insufficient documentation

## 2016-01-10 DIAGNOSIS — Z515 Encounter for palliative care: Secondary | ICD-10-CM

## 2016-01-10 LAB — CULTURE, BLOOD (ROUTINE X 2)
Culture: NO GROWTH
Culture: NO GROWTH

## 2016-01-12 NOTE — Discharge Summary (Addendum)
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 male, ESRD not on HD, Dm2, w c/o 1st digit red toe. Pt is not quite sure how long it has been going on possibly 14monthor longer. Pt denies fever, chills, discharge. Pt presented to ED for evaluation.   In ED, Xray negative for osteomyelitis. Creatinine elevated. Pt is going to be admitted for cellulitis , skin ulcer. "  Hospital Course:  The patient presented with generalized weakness as well as toe ulcer ongoing for last 1 month. Patient was started on IV antibiotics. Along with the patient was also having shortness of breath and was anemic and was given one unit of PRBC. Patient developed significant 1 and more lordotic requiring oxygen and IV Lasix after that. Patient's renal function worsened after diuresis. His oxygenation as well as mentation also worsened further. Palliative care was consulted since the patient has refused hemodialysis in the past and after discussing with the patient's family,patient was transitioned to comfort care.  1. 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 Leukocytosis on admission with anemia, ESR 111, CRP 19.1. No Lactic acidosis, blood  culture no growth. MRSA PCR negative. Patient was initially started on vancomycin and Zosyn. Later on transition to cefepime and Flagyl and vancomycin. Orthopedics consulted and feels patient is not a surgical candidate. Vascular ABI and MRI foot ordered but cannot be completed due to patient's condition. At present the goal of care is to transition to comfort. We will discontinue antibiotics.  2. Acute respiratory distress. Acute respiroatory failure with hypoxia, present not on admission. Transfusion associated volume overload versus lung injury. Pathology lab recommend no evidence of transfusion reaction or TRALI.  Patient has progressive shortness of breath at home, but developed significant shortness of breath after receiving blood transfusion. Bilateral expiratory wheezing persistent Given DuoNeb's, no significant benefit after adding steroids.. Initial ABG shows mild hypoxia unchanged from prior. Negative ins and outs after IV Lasix. Despite adequate diuresis there is no significant of present patient's respiratory status and it actually continues to worsen. Discuss with family regarding goals of care and they would like to transition patient's care to comfort.  3. Acute on Chronic kidney disease stage IV. Hyponatremia, resolved Acute metabolic encephalopathy from uremia  Patient has refused hemodialysis in the past and continues to do so. Renal function progressively worsened after IV diuresis. I briefly discussed with nephrology who knew patient very well. Recommended to continue IV diuresis and monitor improvement. but they were of the opinion that this could be a terminal event for the patient due to lack of a Diuretic response.  Family was informed regarding patient's guarded prognosis as well as recommendations from nephrology. With significant  elevation in BUN patient appears to be uremic with acute encephalopathy. Discussed with patient's family regarding goals of care and  they continued to mention that the patient has refused hemodialysis in the past and would not pursue that again. At present based on that information patient was transitioned to comfort care.  4. Type 2 diabetes mellitus. Uncontrolled Hemoglobin A1c 8.1 in May 2017. Comfort care   5. Concern for aspiration. Comfort feeds  6. Anemia of chronic disease. Patient presented with hemoglobin of 7.3, recently discharged with hemoglobin of 9.6. Patient received one unit of PRBC transfusion and hemoglobin rise to appropriately to 8.4. Developed respiratory distress probably from volume overload. Pathology lab recommend no evidence of transfusion reaction or TRALI. Now transition to comfort care. No evidence of active bleeding.  7. Suspected acute on chronic diastolic dysfunction. Patient requiring significant respiratory support. Patient given IV Lasix, at present renal function worsening, will monitor. echo 55-60% EF.  8. Agitation. Started the patient on IV Haldol as needed. Added Seroquel.  9. Goals of care discussion. Patient's mentation is progressively worsening with recurrent agitation not resolving with Haldol. Patient's renal function is also worsening after diuresis. Patient continues to have shortness of breath despite adequate diuresis as well as having increasing oxygen requirement. I discussed with family regarding their goals of care who prefers of the patient did not want to prolong the inevitable and would like to maintain patient comfortable. With worsening mentation patient was transitioned to complete comfort care. Appreciate input from palliative care. Patient started on IV morphine for respiratory distress and agitation.  The patient was pronounced deceased at 2 AM on Jan 28, 2016.  Procedures and Results:  Echocardiogram   Consultations:  Orthopedics, palliative care  The results of significant diagnostics from this hospitalization (including imaging,  microbiology, ancillary and laboratory) are listed below for reference.    Significant Diagnostic Studies: Dg Eye Foreign Body  01/08/2016  CLINICAL DATA:  Metal working/exposure; clearance prior to MRI EXAM: ORBITS FOR FOREIGN BODY - 2 VIEW COMPARISON:  Head CT 11/12/2015 FINDINGS: There is no evidence of metallic foreign body within the orbits. No significant bone abnormality identified. IMPRESSION: No evidence of metallic foreign body within the orbits. Electronically Signed   By: Monte Fantasia M.D.   On: 01/08/2016 13:08   Dg Chest 2 View  12/24/2015  CLINICAL DATA:  Acute onset of right foot pain and swelling. Fever. Recent fall. Initial encounter. EXAM: CHEST  2 VIEW COMPARISON:  Chest radiograph performed 10/25/2013, and CT of the chest performed 06/25/2015 FINDINGS: The lungs are well-aerated. Vascular congestion is noted. Mild bibasilar opacities may reflect atelectasis or possibly mild infection. Postoperative change is noted at the right midlung. No pleural effusion or pneumothorax is seen. The heart is normal in size; the mediastinal contour is within normal limits. No acute osseous abnormalities are seen. IMPRESSION: Vascular congestion noted. Mild bibasilar opacities may reflect atelectasis or possibly mild infection. Electronically Signed   By: Garald Balding M.D.   On: 12/17/2015 20:44   Dg Chest Port 1 View  01/08/2016  CLINICAL DATA:  Acute hypoxemic respiratory failure EXAM: PORTABLE CHEST 1 VIEW COMPARISON:  Two days ago FINDINGS: Decreased interstitial prominence suggesting edema that has improved. Pneumonitis, including related to possible TRALI would be indistinguishable. Normal heart size and stable mediastinal contours. No effusion or pneumothorax. Changes of lung cancer treatment on the right. IMPRESSION: Decreased interstitial opacity favoring improved edema. Electronically Signed   By: Monte Fantasia M.D.   On: 01/08/2016 07:28  Dg Chest Port 1 View  01/06/2016  CLINICAL  DATA:  Acute onset of shortness of breath. Initial encounter. EXAM: PORTABLE CHEST 1 VIEW COMPARISON:  Chest radiograph performed 12/17/2015 FINDINGS: There is elevation of the left hemidiaphragm. Vascular congestion is noted. Increased interstitial markings raise concern for pulmonary edema. Postoperative change is noted at the right midlung. No definite pleural effusion or pneumothorax is seen, though the right lung base is incompletely imaged on this study. The cardiomediastinal silhouette is borderline normal in size. No acute osseous abnormalities are seen. IMPRESSION: Elevation of the left hemidiaphragm. Vascular congestion noted. Increased interstitial markings raise concern for pulmonary edema. Electronically Signed   By: Garald Balding M.D.   On: 01/06/2016 02:57   Dg Foot Complete Right  12/18/2015  CLINICAL DATA:  Acute onset of right foot swelling and erythema. Fever. Recent fall. Initial encounter. EXAM: RIGHT FOOT COMPLETE - 3+ VIEW COMPARISON:  None. FINDINGS: There is no evidence of fracture or dislocation. No osseous erosions are identified. The joint spaces are preserved. There is no evidence of talar subluxation; the subtalar joint is unremarkable in appearance. A plantar calcaneal spur is noted. A small os peroneum is seen. Diffuse vascular calcifications are seen. A soft tissue laceration is noted along the lateral aspect of the fifth toe, with diffuse soft tissue swelling about the lateral forefoot. IMPRESSION: 1. No evidence of fracture or dislocation. No osseous erosions seen. 2. Soft tissue laceration along the lateral aspect of the fifth toe, with diffuse soft tissue swelling about the lateral forefoot. 3. Diffuse vascular calcifications seen. Electronically Signed   By: Garald Balding M.D.   On: 12/21/2015 20:46    Microbiology: Recent Results (from the past 240 hour(s))  Culture, blood (routine x 2)     Status: None   Collection Time: 12/22/2015  8:10 PM  Result Value Ref Range  Status   Specimen Description BLOOD RIGHT FOREARM  Final   Special Requests BOTTLES DRAWN AEROBIC AND ANAEROBIC 5CC   Final   Culture NO GROWTH 5 DAYS  Final   Report Status 02/07/2016 FINAL  Final  Culture, blood (routine x 2)     Status: None   Collection Time: 12/21/2015  9:20 PM  Result Value Ref Range Status   Specimen Description BLOOD LEFT HAND  Final   Special Requests IN PEDIATRIC BOTTLE 3CC  Final   Culture NO GROWTH 5 DAYS  Final   Report Status 2016/02/07 FINAL  Final  MRSA PCR Screening     Status: None   Collection Time: 01/06/16 11:56 AM  Result Value Ref Range Status   MRSA by PCR NEGATIVE NEGATIVE Final    Comment:        The GeneXpert MRSA Assay (FDA approved for NASAL specimens only), is one component of a comprehensive MRSA colonization surveillance program. It is not intended to diagnose MRSA infection nor to guide or monitor treatment for MRSA infections.      Labs: CBC:  Recent Labs Lab 01/04/2016 2010 01/06/16 0506 01/07/16 0250 01/08/16 0816 01/08/16 2200 01/09/16 0500  WBC 16.6* 16.4* 16.3* 16.0* 17.0* 18.9*  NEUTROABS 14.3*  --  13.5* 13.9* 16.6*  --   HGB 7.3* 8.4* 8.9* 8.2* 8.6* 8.4*  HCT 22.8* 25.8* 26.3* 24.9* 26.9* 25.2*  MCV 98.3 95.2 95.3 97.3 98.9 97.3  PLT 223 222 267 263 310 166   Basic Metabolic Panel:  Recent Labs Lab 01/06/16 0506 01/07/16 0250 01/08/16 0816 01/08/16 2200 01/09/16 0500  NA 130* 131* 135  133* 134*  K 4.4 3.8 4.7 5.7* 5.7*  CL 106 104 106 105 102  CO2 14* 16* 11* 7* 8*  GLUCOSE 202* 89 165* 343* 342*  BUN 56* 60* 83* 104* 128*  CREATININE 4.04* 4.50* 5.29* 6.36* 6.69*  CALCIUM 8.0* 8.3* 8.3* 8.3* 8.2*  MG  --  1.6* 2.3  --  2.6*   Liver Function Tests:  Recent Labs Lab 12/17/2015 2010 01/06/16 0506 01/07/16 0250 01/08/16 0816 01/08/16 2200  AST 15 15 24  39 34  ALT 13* 14* 17 22 25   ALKPHOS 52 55 65 53 61  BILITOT 0.5 0.9 0.9 0.7 1.1  PROT 6.0* 6.4* 6.8 6.4* 6.4*  ALBUMIN 2.8* 2.7* 2.9*  2.7* 2.7*    Recent Labs Lab 01/02/2016 2010  LIPASE 31   No results for input(s): AMMONIA in the last 168 hours. Cardiac Enzymes:  Recent Labs Lab 01/07/16 0250  TROPONINI 0.25*   BNP (last 3 results) No results for input(s): BNP in the last 8760 hours. CBG:  Recent Labs Lab 01/08/16 1139 01/08/16 1613 01/08/16 2202 01/08/16 2206 01/09/16 0817  GLUCAP 184* 263* 346* 331* 373*   Time spent: 15 minutes  Signed:  Jonhatan Hearty  [facility] 14-Jan-2016, 6:25 PM

## 2016-01-12 NOTE — Progress Notes (Signed)
Pt expired at 24 with son and daughter at the bedside.  Post-Mortem checklist now complete.

## 2016-01-12 NOTE — Progress Notes (Signed)
Pt expired at 0200. Pronounced by 2 RNs. Pt was DNR and comfort care only. Death certificate completed.  KJKG, NP [facility]

## 2016-01-12 NOTE — Progress Notes (Signed)
200 ml of morphine wasted in sink by Remo Lipps, RN & Brien Mates, RN.

## 2016-01-12 DEATH — deceased

## 2016-12-07 IMAGING — CT CT CERVICAL SPINE W/O CM
3 of 6 series · 10 of 33 positions shown, 12 images · non-contrast
Comparison: Multiple exams, including 10/25/2013

CLINICAL DATA: Fall this morning with subsequent confusion. Left
posterior neck pain extending into the left shoulder. Left occipital
and vertex headache.

EXAM:
CT HEAD WITHOUT CONTRAST
CT CERVICAL SPINE WITHOUT CONTRAST
TECHNIQUE: Multidetector CT imaging of the head and cervical spine was
performed following the standard protocol without intravenous
contrast. Multiplanar CT image reconstructions of the cervical spine
were also generated.

[Series 305: sagittal · sagittal · 0.43mm/px · 5 of 54 slices shown, 6 images]
[im 18/54  bone]
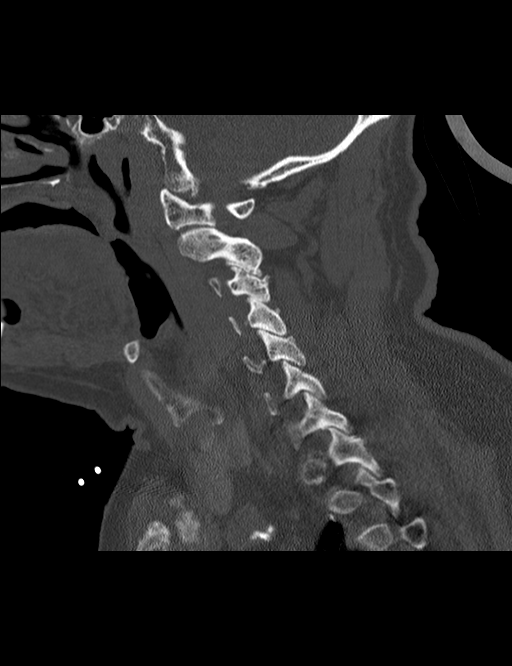
[im 23/54  bone]
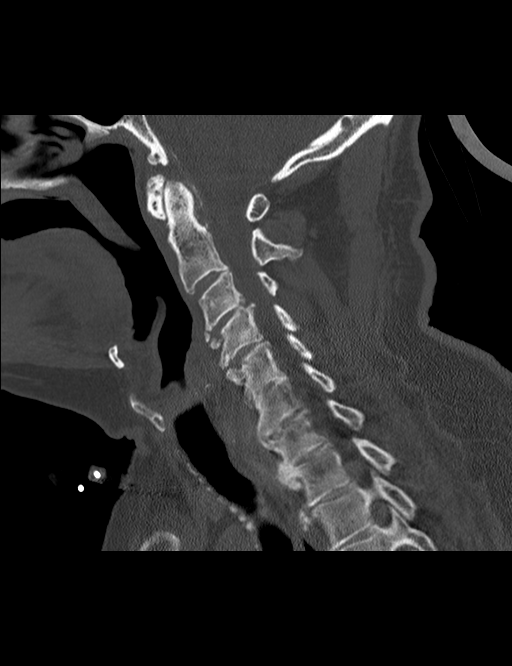
[im 27/54  soft-tissue]
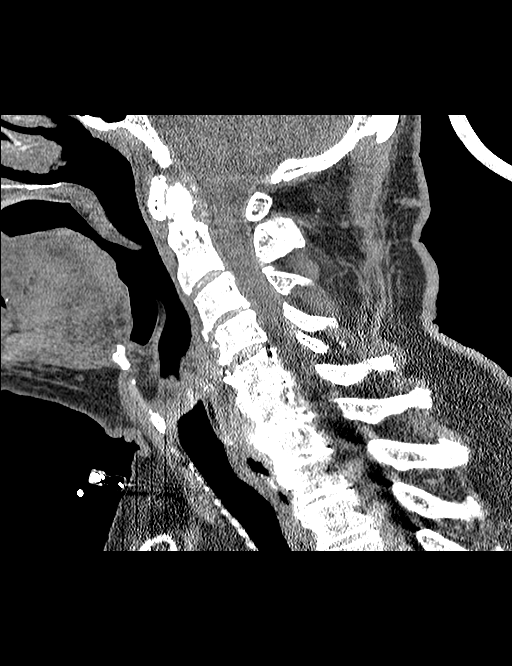
[im 27/54  bone]
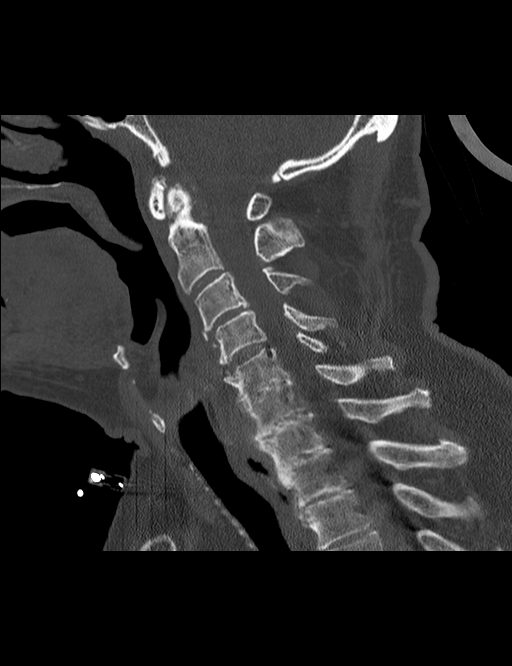
[im 31/54  bone]
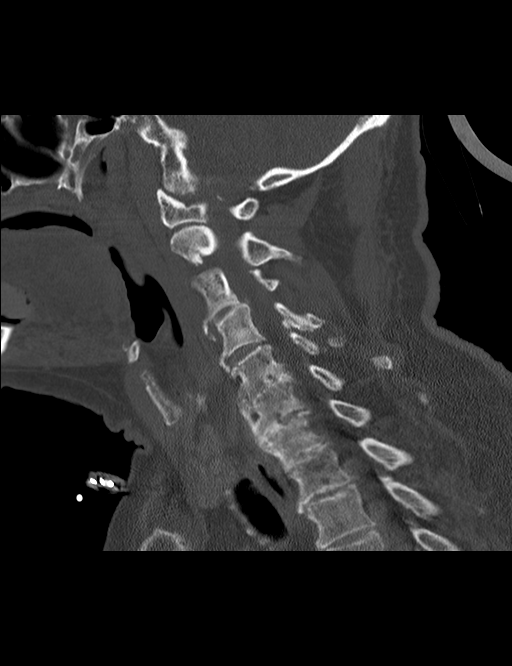
[im 36/54  bone]
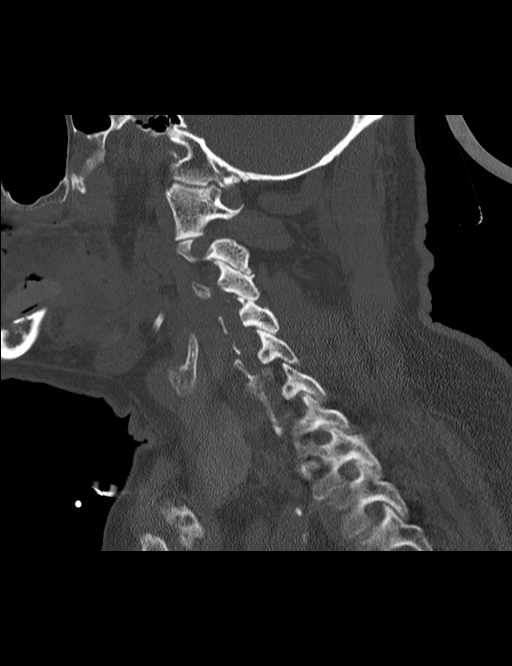

[Series 306: coronal · coronal · 0.43mm/px · 3 of 39 slices shown]
[im 8/39  bone]
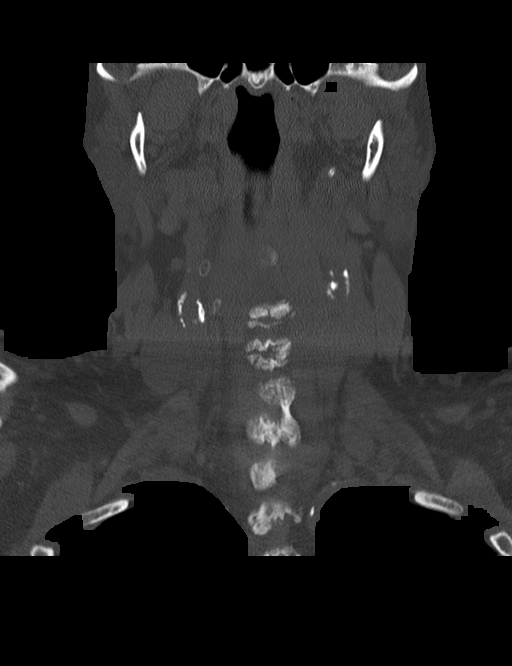
[im 16/39  bone]
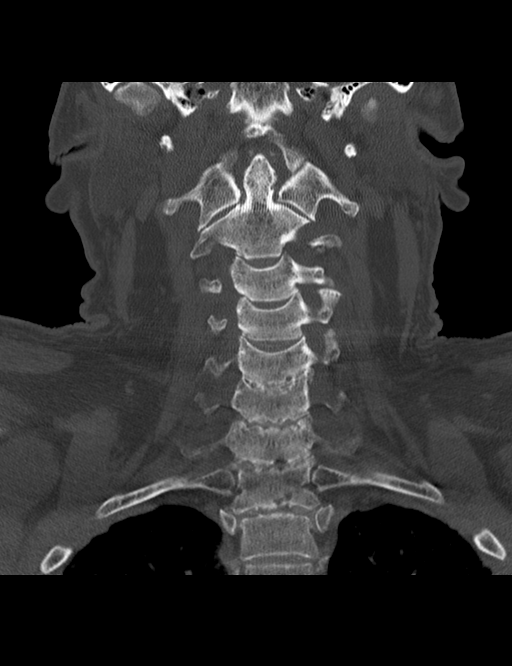
[im 23/39  bone]
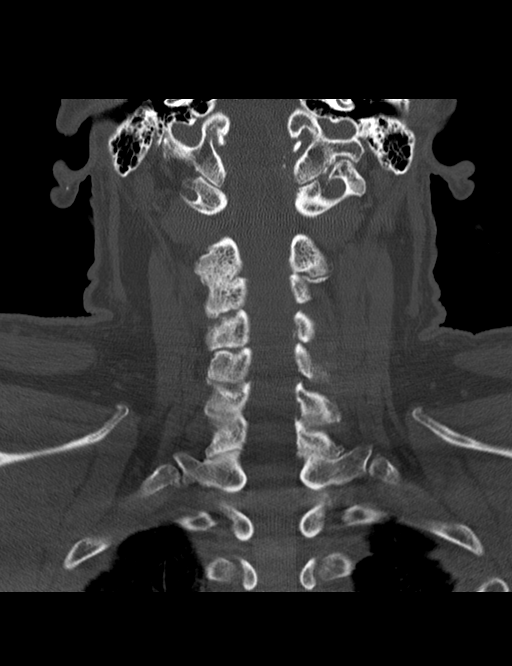

[Series 307: orthogonal · axial · 0.43mm/px · z∈[+63,+124]mm · 2 of 93 slices shown, 3 images]
[im 31/93  soft-tissue]
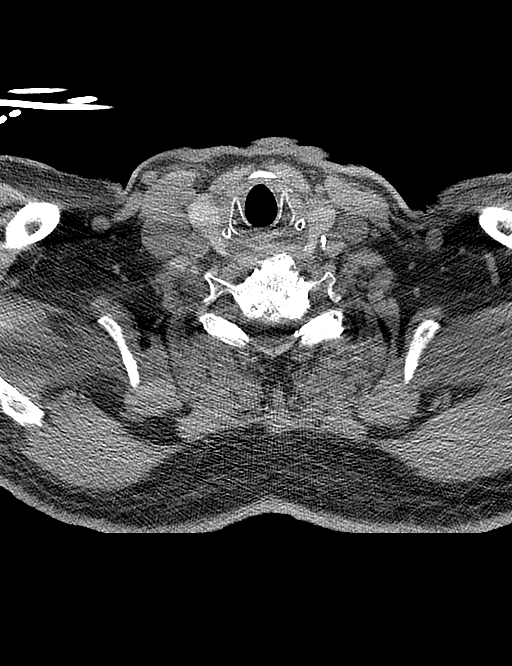
[im 31/93  bone]
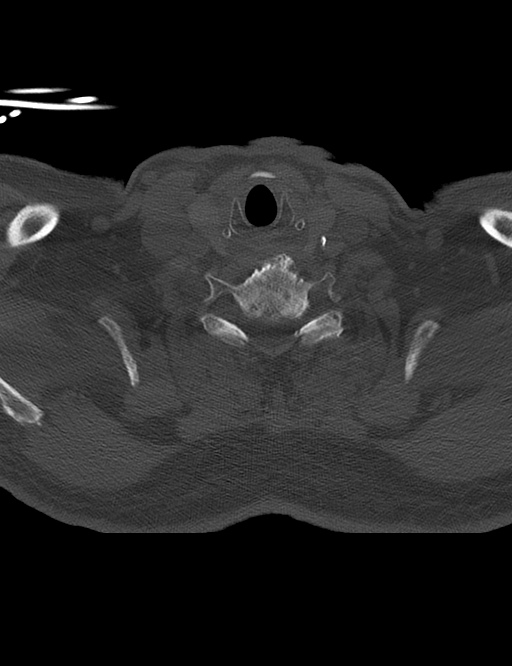
[im 62/93  bone]
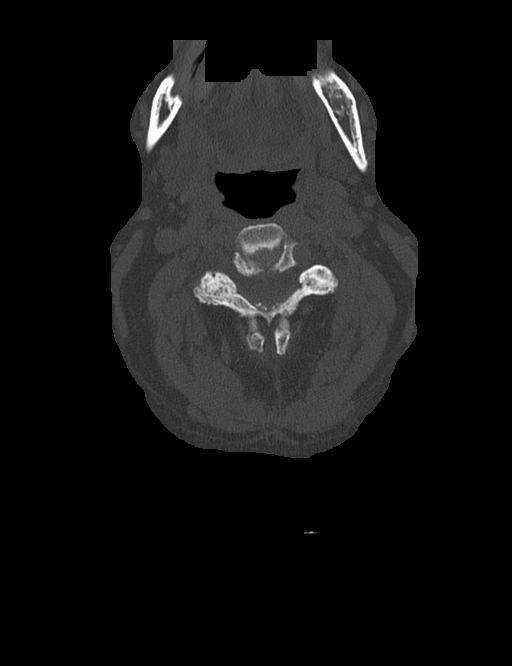

[10 of 33 positions shown; findings below may reference images not displayed]

FINDINGS: CT HEAD FINDINGS

Chronic periventricular white matter, corona radiata, and central
pontine hypodensity compatible with chronic ischemic microvascular
white matter disease.

Otherwise, the brainstem, cerebellum, cerebral peduncles, thalami,
basal ganglia, basilar cisterns, and ventricular system appear
within normal limits. No intracranial hemorrhage, mass lesion, or
acute CVA. Chronic high density in the globes, possibly from
Perfluoro-n-Octane or similar injection in the past, correlate with
ophthalmic history.

There is atherosclerotic calcification of the cavernous carotid
arteries bilaterally.

CT CERVICAL SPINE FINDINGS

Calcification of the transverse ligament of C1. Prominent loss of
intervertebral disc height at C5-6, C6-7, and C7-T1 possibly with
some acquired interbody fusion at these levels, especially C5-6.

Uncinate spurring likely leads to at least mild foraminal stenosis
on the right at C3-4 and on the left at C5-6, and C6-7.

No cervical spine fracture or acute subluxation is evident.
Bilateral sternoclavicular degenerative arthropathy.

Bilateral carotid artery atherosclerotic calcification noted.
Emphysema at the lung apices along with some mild biapical scarring.
IMPRESSION: 1. No acute intracranial findings are acute cervical spine findings.
2. Cervical spondylosis and degenerative disc disease.
3. Carotid atherosclerotic calcification.
4. Emphysema at the lung apices.
5. Bilateral degenerative sternoclavicular arthropathy.
6. Periventricular white matter and corona radiata hypodensities
favor chronic ischemic microvascular white matter disease.
